# Patient Record
Sex: Female | Born: 1965
Health system: Southern US, Community
[De-identification: ages and names within clinical notes are randomized; demographics above are authoritative.]

## PROBLEM LIST (undated history)

## (undated) DIAGNOSIS — R87619 Unspecified abnormal cytological findings in specimens from cervix uteri: Secondary | ICD-10-CM

## (undated) DIAGNOSIS — G589 Mononeuropathy, unspecified: Secondary | ICD-10-CM

## (undated) DIAGNOSIS — I2699 Other pulmonary embolism without acute cor pulmonale: Secondary | ICD-10-CM

## (undated) DIAGNOSIS — D649 Anemia, unspecified: Secondary | ICD-10-CM

## (undated) DIAGNOSIS — Z9289 Personal history of other medical treatment: Secondary | ICD-10-CM

## (undated) DIAGNOSIS — M797 Fibromyalgia: Secondary | ICD-10-CM

## (undated) DIAGNOSIS — G43909 Migraine, unspecified, not intractable, without status migrainosus: Secondary | ICD-10-CM

## (undated) DIAGNOSIS — E669 Obesity, unspecified: Secondary | ICD-10-CM

## (undated) DIAGNOSIS — R58 Hemorrhage, not elsewhere classified: Secondary | ICD-10-CM

## (undated) HISTORY — DX: Unspecified abnormal cytological findings in specimens from cervix uteri: R87.619

## (undated) HISTORY — PX: DILATION AND CURETTAGE OF UTERUS: SHX78

## (undated) HISTORY — DX: Hemorrhage, not elsewhere classified: R58

## (undated) HISTORY — PX: APPENDECTOMY: SHX54

## (undated) HISTORY — PX: CARDIAC CATHETERIZATION: SHX172

## (undated) HISTORY — DX: Migraine, unspecified, not intractable, without status migrainosus: G43.909

## (undated) HISTORY — DX: Mononeuropathy, unspecified: G58.9

## (undated) HISTORY — DX: Anemia, unspecified: D64.9

## (undated) HISTORY — DX: Obesity, unspecified: E66.9

## (undated) HISTORY — DX: Personal history of other medical treatment: Z92.89

## (undated) HISTORY — DX: Other pulmonary embolism without acute cor pulmonale: I26.99

## (undated) HISTORY — DX: Fibromyalgia: M79.7

## (undated) HISTORY — PX: OTHER SURGICAL HISTORY: SHX169

## (undated) HISTORY — PX: CHOLECYSTECTOMY: SHX55

---

## 2010-04-21 ENCOUNTER — Ambulatory Visit: Payer: Self-pay | Admitting: Cardiology

## 2010-08-06 LAB — PROTIME-INR: INR: 2.5 — AB (ref 0.9–1.1)

## 2010-10-08 LAB — PROTIME-INR: INR: 1.1 (ref 0.9–1.1)

## 2010-10-09 LAB — PROTIME-INR: INR: 1.6 — AB (ref 0.9–1.1)

## 2010-11-11 LAB — PROTIME-INR: INR: 2 — AB (ref 0.9–1.1)

## 2010-12-10 LAB — PROTIME-INR

## 2013-03-13 DIAGNOSIS — Z6835 Body mass index (BMI) 35.0-35.9, adult: Secondary | ICD-10-CM | POA: Diagnosis not present

## 2013-03-13 DIAGNOSIS — IMO0002 Reserved for concepts with insufficient information to code with codable children: Secondary | ICD-10-CM | POA: Diagnosis not present

## 2013-03-13 DIAGNOSIS — IMO0001 Reserved for inherently not codable concepts without codable children: Secondary | ICD-10-CM | POA: Diagnosis not present

## 2013-03-13 DIAGNOSIS — I2699 Other pulmonary embolism without acute cor pulmonale: Secondary | ICD-10-CM | POA: Diagnosis not present

## 2013-03-13 DIAGNOSIS — N912 Amenorrhea, unspecified: Secondary | ICD-10-CM | POA: Diagnosis not present

## 2013-03-13 DIAGNOSIS — T81718A Complication of other artery following a procedure, not elsewhere classified, initial encounter: Secondary | ICD-10-CM | POA: Diagnosis not present

## 2013-11-09 DIAGNOSIS — H6690 Otitis media, unspecified, unspecified ear: Secondary | ICD-10-CM | POA: Diagnosis not present

## 2013-11-09 DIAGNOSIS — H60399 Other infective otitis externa, unspecified ear: Secondary | ICD-10-CM | POA: Diagnosis not present

## 2014-09-22 DIAGNOSIS — N939 Abnormal uterine and vaginal bleeding, unspecified: Secondary | ICD-10-CM | POA: Diagnosis not present

## 2014-09-22 DIAGNOSIS — M79606 Pain in leg, unspecified: Secondary | ICD-10-CM | POA: Diagnosis not present

## 2014-09-22 DIAGNOSIS — Z86711 Personal history of pulmonary embolism: Secondary | ICD-10-CM | POA: Diagnosis not present

## 2014-09-22 DIAGNOSIS — R0602 Shortness of breath: Secondary | ICD-10-CM | POA: Diagnosis not present

## 2014-09-22 DIAGNOSIS — Z9889 Other specified postprocedural states: Secondary | ICD-10-CM | POA: Diagnosis not present

## 2014-09-22 DIAGNOSIS — Z7982 Long term (current) use of aspirin: Secondary | ICD-10-CM | POA: Diagnosis not present

## 2014-11-14 ENCOUNTER — Encounter: Payer: Self-pay | Admitting: Family

## 2014-11-14 ENCOUNTER — Ambulatory Visit (INDEPENDENT_AMBULATORY_CARE_PROVIDER_SITE_OTHER): Payer: Medicare Other | Admitting: Family

## 2014-11-14 VITALS — BP 145/103 | HR 76 | Temp 97.0°F | Ht 62.5 in | Wt 223.0 lb

## 2014-11-14 DIAGNOSIS — G629 Polyneuropathy, unspecified: Secondary | ICD-10-CM | POA: Diagnosis not present

## 2014-11-14 DIAGNOSIS — R5383 Other fatigue: Secondary | ICD-10-CM | POA: Diagnosis not present

## 2014-11-14 DIAGNOSIS — E559 Vitamin D deficiency, unspecified: Secondary | ICD-10-CM | POA: Diagnosis not present

## 2014-11-14 DIAGNOSIS — Z13220 Encounter for screening for lipoid disorders: Secondary | ICD-10-CM

## 2014-11-14 DIAGNOSIS — I10 Essential (primary) hypertension: Secondary | ICD-10-CM | POA: Diagnosis not present

## 2014-11-14 DIAGNOSIS — D6489 Other specified anemias: Secondary | ICD-10-CM | POA: Diagnosis not present

## 2014-11-14 DIAGNOSIS — G4700 Insomnia, unspecified: Secondary | ICD-10-CM | POA: Insufficient documentation

## 2014-11-14 DIAGNOSIS — Z1321 Encounter for screening for nutritional disorder: Secondary | ICD-10-CM | POA: Diagnosis not present

## 2014-11-14 DIAGNOSIS — Z8349 Family history of other endocrine, nutritional and metabolic diseases: Secondary | ICD-10-CM

## 2014-11-14 DIAGNOSIS — Z8342 Family history of familial hypercholesterolemia: Secondary | ICD-10-CM

## 2014-11-14 DIAGNOSIS — D649 Anemia, unspecified: Secondary | ICD-10-CM | POA: Insufficient documentation

## 2014-11-14 MED ORDER — GABAPENTIN 100 MG PO CAPS: 100.0000 mg | ORAL_CAPSULE | Freq: Two times a day (BID) | ORAL | Status: DC

## 2014-11-14 MED ORDER — HYDROCHLOROTHIAZIDE 25 MG PO TABS: 25.0000 mg | ORAL_TABLET | Freq: Every day | ORAL | Status: DC

## 2014-11-14 NOTE — Patient Instructions (Addendum)
Health Maintenance Adopting a healthy lifestyle and getting preventive care can go a long way to promote health and wellness. Talk with your health care provider about what schedule of regular examinations is right for you. This is a good chance for you to check in with your provider about disease prevention and staying healthy. In between checkups, there are plenty of things you can do on your own. Experts have done a lot of research about which lifestyle changes and preventive measures are most likely to keep you healthy. Ask your health care provider for more information. WEIGHT AND DIET  Eat a healthy diet  Be sure to include plenty of vegetables, fruits, low-fat dairy products, and lean protein.  Do not eat a lot of foods high in solid fats, added sugars, or salt.  Get regular exercise. This is one of the most important things you can do for your health.  Most adults should exercise for at least 150 minutes each week. The exercise should increase your heart rate and make you sweat (moderate-intensity exercise).  Most adults should also do strengthening exercises at least twice a week. This is in addition to the moderate-intensity exercise.  Maintain a healthy weight  Body mass index (BMI) is a measurement that can be used to identify possible weight problems. It estimates body fat based on height and weight. Your health care provider can help determine your BMI and help you achieve or maintain a healthy weight.  For females 25 years of age and older:   A BMI below 18.5 is considered underweight.  A BMI of 18.5 to 24.9 is normal.  A BMI of 25 to 29.9 is considered overweight.  A BMI of 30 and above is considered obese.  Watch levels of cholesterol and blood lipids  You should start having your blood tested for lipids and cholesterol at 47 years of age, then have this test every 5 years.  You may need to have your cholesterol levels checked more often if:  Your lipid or  cholesterol levels are high.  You are older than 47 years of age.  You are at high risk for heart disease.  CANCER SCREENING   Lung Cancer  Lung cancer screening is recommended for adults 97-92 years old who are at high risk for lung cancer because of a history of smoking.  A yearly low-dose CT scan of the lungs is recommended for people who:  Currently smoke.  Have quit within the past 15 years.  Have at least a 30-pack-year history of smoking. A pack year is smoking an average of one pack of cigarettes a day for 1 year.  Yearly screening should continue until it has been 15 years since you quit.  Yearly screening should stop if you develop a health problem that would prevent you from having lung cancer treatment.  Breast Cancer  Practice breast self-awareness. This means understanding how your breasts normally appear and feel.  It also means doing regular breast self-exams. Let your health care provider know about any changes, no matter how small.  If you are in your 20s or 30s, you should have a clinical breast exam (CBE) by a health care provider every 1-3 years as part of a regular health exam.  If you are 76 or older, have a CBE every year. Also consider having a breast X-ray (mammogram) every year.  If you have a family history of breast cancer, talk to your health care provider about genetic screening.  If you are  at high risk for breast cancer, talk to your health care provider about having an MRI and a mammogram every year.  Breast cancer gene (BRCA) assessment is recommended for women who have family members with BRCA-related cancers. BRCA-related cancers include:  Breast.  Ovarian.  Tubal.  Peritoneal cancers.  Results of the assessment will determine the need for genetic counseling and BRCA1 and BRCA2 testing. Cervical Cancer Routine pelvic examinations to screen for cervical cancer are no longer recommended for nonpregnant women who are considered low  risk for cancer of the pelvic organs (ovaries, uterus, and vagina) and who do not have symptoms. A pelvic examination may be necessary if you have symptoms including those associated with pelvic infections. Ask your health care provider if a screening pelvic exam is right for you.   The Pap test is the screening test for cervical cancer for women who are considered at risk.  If you had a hysterectomy for a problem that was not cancer or a condition that could lead to cancer, then you no longer need Pap tests.  If you are older than 65 years, and you have had normal Pap tests for the past 10 years, you no longer need to have Pap tests.  If you have had past treatment for cervical cancer or a condition that could lead to cancer, you need Pap tests and screening for cancer for at least 20 years after your treatment.  If you no longer get a Pap test, assess your risk factors if they change (such as having a new sexual partner). This can affect whether you should start being screened again.  Some women have medical problems that increase their chance of getting cervical cancer. If this is the case for you, your health care provider may recommend more frequent screening and Pap tests.  The human papillomavirus (HPV) test is another test that may be used for cervical cancer screening. The HPV test looks for the virus that can cause cell changes in the cervix. The cells collected during the Pap test can be tested for HPV.  The HPV test can be used to screen women 30 years of age and older. Getting tested for HPV can extend the interval between normal Pap tests from three to five years.  An HPV test also should be used to screen women of any age who have unclear Pap test results.  After 47 years of age, women should have HPV testing as often as Pap tests.  Colorectal Cancer  This type of cancer can be detected and often prevented.  Routine colorectal cancer screening usually begins at 47 years of  age and continues through 47 years of age.  Your health care provider may recommend screening at an earlier age if you have risk factors for colon cancer.  Your health care provider may also recommend using home test kits to check for hidden blood in the stool.  A small camera at the end of a tube can be used to examine your colon directly (sigmoidoscopy or colonoscopy). This is done to check for the earliest forms of colorectal cancer.  Routine screening usually begins at age 50.  Direct examination of the colon should be repeated every 5-10 years through 47 years of age. However, you may need to be screened more often if early forms of precancerous polyps or small growths are found. Skin Cancer  Check your skin from head to toe regularly.  Tell your health care provider about any new moles or changes in   moles, especially if there is a change in a mole's shape or color.  Also tell your health care provider if you have a mole that is larger than the size of a pencil eraser.  Always use sunscreen. Apply sunscreen liberally and repeatedly throughout the day.  Protect yourself by wearing long sleeves, pants, a wide-brimmed hat, and sunglasses whenever you are outside. HEART DISEASE, DIABETES, AND HIGH BLOOD PRESSURE   Have your blood pressure checked at least every 1-2 years. High blood pressure causes heart disease and increases the risk of stroke.  If you are between 75 years and 42 years old, ask your health care provider if you should take aspirin to prevent strokes.  Have regular diabetes screenings. This involves taking a blood sample to check your fasting blood sugar level.  If you are at a normal weight and have a low risk for diabetes, have this test once every three years after 47 years of age.  If you are overweight and have a high risk for diabetes, consider being tested at a younger age or more often. PREVENTING INFECTION  Hepatitis B  If you have a higher risk for  hepatitis B, you should be screened for this virus. You are considered at high risk for hepatitis B if:  You were born in a country where hepatitis B is common. Ask your health care provider which countries are considered high risk.  Your parents were born in a high-risk country, and you have not been immunized against hepatitis B (hepatitis B vaccine).  You have HIV or AIDS.  You use needles to inject street drugs.  You live with someone who has hepatitis B.  You have had sex with someone who has hepatitis B.  You get hemodialysis treatment.  You take certain medicines for conditions, including cancer, organ transplantation, and autoimmune conditions. Hepatitis C  Blood testing is recommended for:  Everyone born from [birth year] through 1963.  Anyone with known risk factors for hepatitis C. Sexually transmitted infections (STIs)  You should be screened for sexually transmitted infections (STIs) including gonorrhea and chlamydia if:  You are sexually active and are younger than 48 years of age.  You are older than 47 years of age and your health care provider tells you that you are at risk for this type of infection.  Your sexual activity has changed since you were last screened and you are at an increased risk for chlamydia or gonorrhea. Ask your health care provider if you are at risk.  If you do not have HIV, but are at risk, it may be recommended that you take a prescription medicine daily to prevent HIV infection. This is called pre-exposure prophylaxis (PrEP). You are considered at risk if:  You are sexually active and do not regularly use condoms or know the HIV status of your partner(s).  You take drugs by injection.  You are sexually active with a partner who has HIV. Talk with your health care provider about whether you are at high risk of being infected with HIV. If you choose to begin PrEP, you should first be tested for HIV. You should then be tested every 3 months for  as long as you are taking PrEP.  PREGNANCY   If you are premenopausal and you may become pregnant, ask your health care provider about preconception counseling.  If you may become pregnant, take 400 to 800 micrograms (mcg) of folic acid every day.  If you want to prevent pregnancy, talk to your  health care provider about birth control (contraception). OSTEOPOROSIS AND MENOPAUSE   Osteoporosis is a disease in which the bones lose minerals and strength with aging. This can result in serious bone fractures. Your risk for osteoporosis can be identified using a bone density scan.  If you are 38 years of age or older, or if you are at risk for osteoporosis and fractures, ask your health care provider if you should be screened.  Ask your health care provider whether you should take a calcium or vitamin D supplement to lower your risk for osteoporosis.  Menopause may have certain physical symptoms and risks.  Hormone replacement therapy may reduce some of these symptoms and risks. Talk to your health care provider about whether hormone replacement therapy is right for you.  HOME CARE INSTRUCTIONS   Schedule regular health, dental, and eye exams.  Stay current with your immunizations.   Do not use any tobacco products including cigarettes, chewing tobacco, or electronic cigarettes.  If you are pregnant, do not drink alcohol.  If you are breastfeeding, limit how much and how often you drink alcohol.  Limit alcohol intake to no more than 1 drink per day for nonpregnant women. One drink equals 12 ounces of beer, 5 ounces of wine, or 1 ounces of hard liquor.  Do not use street drugs.  Do not share needles.  Ask your health care provider for help if you need support or information about quitting drugs.  Tell your health care provider if you often feel depressed.  Tell your health care provider if you have ever been abused or do not feel safe at home. Document Released: 03/16/2011  Document Revised: 01/15/2014 Document Reviewed: 08/02/2013 [facility] Patient Information 2015 [place], [place]. This information is not intended to replace advice given to you by your health care provider. Make sure you discuss any questions you have with your health care provider. DASH Eating Plan DASH stands for "Dietary Approaches to Stop Hypertension." The DASH eating plan is a healthy eating plan that has been shown to reduce high blood pressure (hypertension). Additional health benefits may include reducing the risk of type 2 diabetes mellitus, heart disease, and stroke. The DASH eating plan may also help with weight loss. WHAT DO I NEED TO KNOW ABOUT THE DASH EATING PLAN? For the DASH eating plan, you will follow these general guidelines:  Choose foods with a percent daily value for sodium of less than 5% (as listed on the food label).  Use salt-free seasonings or herbs instead of table salt or sea salt.  Check with your health care provider or pharmacist before using salt substitutes.  Eat lower-sodium products, often labeled as "lower sodium" or "no salt added."  Eat fresh foods.  Eat more vegetables, fruits, and low-fat dairy products.  Choose whole grains. Look for the word "whole" as the first word in the ingredient list.  Choose fish and skinless chicken or [place] more often than red meat. Limit fish, poultry, and meat to 6 oz (170 g) each day.  Limit sweets, desserts, sugars, and sugary drinks.  Choose heart-healthy fats.  Limit cheese to 1 oz (28 g) per day.  Eat more home-cooked food and less restaurant, buffet, and fast food.  Limit fried foods.  Cook foods using methods other than frying.  Limit canned vegetables. If you do use them, rinse them well to decrease the sodium.  When eating at a restaurant, ask that your food be prepared with less salt, or no salt if possible.  WHAT FOODS CAN I EAT? Seek help from a dietitian for individual calorie  needs. Grains Whole grain or whole wheat bread. Brown rice. Whole grain or whole wheat pasta. Quinoa, bulgur, and whole grain cereals. Low-sodium cereals. Corn or whole wheat flour tortillas. Whole grain cornbread. Whole grain crackers. Low-sodium crackers. Vegetables Fresh or frozen vegetables (raw, steamed, roasted, or grilled). Low-sodium or reduced-sodium tomato and vegetable juices. Low-sodium or reduced-sodium tomato sauce and paste. Low-sodium or reduced-sodium canned vegetables.  Fruits All fresh, canned (in natural juice), or frozen fruits. Meat and Other Protein Products Ground beef (85% or leaner), grass-fed beef, or beef trimmed of fat. Skinless chicken or [place]. Ground chicken or [place]. Pork trimmed of fat. All fish and seafood. Eggs. Dried beans, peas, or lentils. Unsalted nuts and seeds. Unsalted canned beans. Dairy Low-fat dairy products, such as skim or 1% milk, 2% or reduced-fat cheeses, low-fat ricotta or cottage cheese, or plain low-fat yogurt. Low-sodium or reduced-sodium cheeses. Fats and Oils Tub margarines without trans fats. Light or reduced-fat mayonnaise and salad dressings (reduced sodium). Avocado. Safflower, olive, or canola oils. Natural peanut or almond butter. Other Unsalted popcorn and pretzels. The items listed above may not be a complete list of recommended foods or beverages. Contact your dietitian for more options. WHAT FOODS ARE NOT RECOMMENDED? Grains Dokes bread. Bodenheimer pasta. Neyer rice. Refined cornbread. Bagels and croissants. Crackers that contain trans fat. Vegetables Creamed or fried vegetables. Vegetables in a cheese sauce. Regular canned vegetables. Regular canned tomato sauce and paste. Regular tomato and vegetable juices. Fruits Dried fruits. Canned fruit in light or heavy syrup. Fruit juice. Meat and Other Protein Products Fatty cuts of meat. Ribs, chicken wings, bacon, sausage, bologna, salami, chitterlings, fatback, hot dogs, bratwurst,  and packaged luncheon meats. Salted nuts and seeds. Canned beans with salt. Dairy Whole or 2% milk, cream, half-and-half, and cream cheese. Whole-fat or sweetened yogurt. Full-fat cheeses or blue cheese. Nondairy creamers and whipped toppings. Processed cheese, cheese spreads, or cheese curds. Condiments Onion and garlic salt, seasoned salt, table salt, and sea salt. Canned and packaged gravies. Worcestershire sauce. Tartar sauce. Barbecue sauce. Teriyaki sauce. Soy sauce, including reduced sodium. Steak sauce. Fish sauce. Oyster sauce. Cocktail sauce. Horseradish. Ketchup and mustard. Meat flavorings and tenderizers. Bouillon cubes. Hot sauce. Tabasco sauce. Marinades. Taco seasonings. Relishes. Fats and Oils Butter, stick margarine, lard, shortening, ghee, and bacon fat. Coconut, palm kernel, or palm oils. Regular salad dressings. Other Pickles and olives. Salted popcorn and pretzels. The items listed above may not be a complete list of foods and beverages to avoid. Contact your dietitian for more information. WHERE CAN I FIND MORE INFORMATION? National Heart, Lung, and Blood Institute: travelstabloid.com Document Released: 08/20/2011 Document Revised: 01/15/2014 Document Reviewed: 07/05/2013 [facility] Patient Information 2015 [place], [place]. This information is not intended to replace advice given to you by your health care provider. Make sure you discuss any questions you have with your health care provider. Hypertension Hypertension, commonly called high blood pressure, is when the force of blood pumping through your arteries is too strong. Your arteries are the blood vessels that carry blood from your heart throughout your body. A blood pressure reading consists of a higher number over a lower number, such as 110/72. The higher number (systolic) is the pressure inside your arteries when your heart pumps. The lower number (diastolic) is the pressure inside your  arteries when your heart relaxes. Ideally you want your blood pressure below 120/80. Hypertension forces your heart to work harder  to pump blood. Your arteries may become narrow or stiff. Having hypertension puts you at risk for heart disease, stroke, and other problems.  RISK FACTORS Some risk factors for high blood pressure are controllable. Others are not.  Risk factors you cannot control include:   Race. You may be at higher risk if you are African American.  Age. Risk increases with age.  Gender. Men are at higher risk than women before age 45 years. After age 65, women are at higher risk than men. Risk factors you can control include:  Not getting enough exercise or physical activity.  Being overweight.  Getting too much fat, sugar, calories, or salt in your diet.  Drinking too much alcohol. SIGNS AND SYMPTOMS Hypertension does not usually cause signs or symptoms. Extremely high blood pressure (hypertensive crisis) may cause headache, anxiety, shortness of breath, and nosebleed. DIAGNOSIS  To check if you have hypertension, your health care provider will measure your blood pressure while you are seated, with your arm held at the level of your heart. It should be measured at least twice using the same arm. Certain conditions can cause a difference in blood pressure between your right and left arms. A blood pressure reading that is higher than normal on one occasion does not mean that you need treatment. If one blood pressure reading is high, ask your health care provider about having it checked again. TREATMENT  Treating high blood pressure includes making lifestyle changes and possibly taking medicine. Living a healthy lifestyle can help lower high blood pressure. You may need to change some of your habits. Lifestyle changes may include:  Following the DASH diet. This diet is high in fruits, vegetables, and whole grains. It is low in salt, red meat, and added sugars.  Getting at  least 2 hours of brisk physical activity every week.  Losing weight if necessary.  Not smoking.  Limiting alcoholic beverages.  Learning ways to reduce stress. If lifestyle changes are not enough to get your blood pressure under control, your health care provider may prescribe medicine. You may need to take more than one. Work closely with your health care provider to understand the risks and benefits. HOME CARE INSTRUCTIONS  Have your blood pressure rechecked as directed by your health care provider.   Take medicines only as directed by your health care provider. Follow the directions carefully. Blood pressure medicines must be taken as prescribed. The medicine does not work as well when you skip doses. Skipping doses also puts you at risk for problems.   Do not smoke.   Monitor your blood pressure at home as directed by your health care provider. SEEK MEDICAL CARE IF:   You think you are having a reaction to medicines taken.  You have recurrent headaches or feel dizzy.  You have swelling in your ankles.  You have trouble with your vision. SEEK IMMEDIATE MEDICAL CARE IF:  You develop a severe headache or confusion.  You have unusual weakness, numbness, or feel faint.  You have severe chest or abdominal pain.  You vomit repeatedly.  You have trouble breathing. MAKE SURE YOU:   Understand these instructions.  Will watch your condition.  Will get help right away if you are not doing well or get worse. Document Released: 08/31/2005 Document Revised: 01/15/2014 Document Reviewed: 06/23/2013 [employer] Patient Information 2015 [employer]. This information is not intended to replace advice given to you by your health care provider. Make sure you discuss any questions you have with   your health care provider.

## 2014-11-14 NOTE — Progress Notes (Signed)
   Subjective:    Patient ID: Nancy Watts, female    DOB: 1965/12/01, 49 y.o.   MRN: [id number]  HPI Pt presents to the office to establish care. Pt currently taking gabapentin $RemoveBeforeDEI'100mg'[id number]$  BID for peripheral neuropathy pain. Pt also takes melatonin for sleep every night. 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 Pt is currently taking aspirin daily instead. Pt states she has a history of a "bleeding disorder" but was never diagnosed with a name. Pt states her Hgb dropped to 4 in the past. Pt states her bleeding is stable at this point.     Review of Systems  Constitutional: Negative.   HENT: Negative.   Eyes: Negative.   Respiratory: Negative.  Negative for shortness of breath.   Cardiovascular: Negative.  Negative for palpitations.  Gastrointestinal: Negative.   Endocrine: Negative.   Genitourinary: Negative.   Musculoskeletal: Negative.   Neurological: Negative.  Negative for headaches.  Hematological: Negative.   Psychiatric/Behavioral: Negative.   All other systems reviewed and are negative.      Objective:   Physical Exam  Constitutional: She is oriented to person, place, and time. She appears well-developed and well-nourished. No distress.  HENT:  Head: Normocephalic and atraumatic.  Right Ear: External ear normal.  Left Ear: External ear normal.  Nose: Nose normal.  Mouth/Throat: Oropharynx is clear and moist.  Eyes: Pupils are equal, round, and reactive to light.  Neck: Normal range of motion. Neck supple. No thyromegaly present.  Cardiovascular: Normal rate, regular rhythm, normal heart sounds and intact distal pulses.   No murmur heard. Pulmonary/Chest: Effort normal and breath sounds normal. No respiratory distress. She has no wheezes.  Abdominal: Soft. Bowel sounds are normal. She exhibits no distension. There is no tenderness.  Musculoskeletal: Normal range of motion. She exhibits no edema or tenderness.  Neurological: She is alert  and oriented to person, place, and time. She has normal reflexes. No cranial nerve deficit.  Skin: Skin is warm and dry.  Psychiatric: She has a normal mood and affect. Her behavior is normal. Judgment and thought content normal.  Vitals reviewed.   BP 154/120 mmHg  Pulse 70  Temp(Src) 97 F (36.1 C) (Oral)  Ht 5' 2.5" (1.588 m)  Wt 223 lb (101.152 kg)  BMI 40.11 kg/m2  LMP 10/22/2014       Assessment & Plan:  1. Insomnia - CMP14+EGFR  2. Peripheral neuropathy - CMP14+EGFR - gabapentin (NEURONTIN) 100 MG capsule; Take 1 capsule (100 mg total) by mouth 2 (two) times daily.  Dispense: 180 capsule; Refill: 4  3. Anemia due to other cause - Anemia Profile B  4. Screening cholesterol level - Lipid panel  5. Encounter for vitamin deficiency screening - Vit D  25 hydroxy (rtn osteoporosis monitoring)  6. Other fatigue - Vit D  25 hydroxy (rtn osteoporosis monitoring) - Anemia Profile B - Thyroid Panel With TSH  7. Family history of high cholesterol - Lipid panel  8. Essential hypertension Pt started on HCTZ today -Dash diet information given -Exercise encouraged - Stress Management  -Continue current meds -RTO in 2 weeks - hydrochlorothiazide (HYDRODIURIL) 25 MG tablet; Take 1 tablet (25 mg total) by mouth daily.  Dispense: 90 tablet; Refill: 3   Continue all meds Labs pending Health Maintenance reviewed Diet and exercise encouraged RTO 2 weeks  Evelina Dun, FNP

## 2014-11-15 LAB — ANEMIA PROFILE B
Basophils Absolute: 0 10*3/uL (ref 0.0–0.2)
Basos: 1 %
Eos: 5 %
Eosinophils Absolute: 0.4 10*3/uL (ref 0.0–0.4)
Ferritin: 27 ng/mL (ref 15–150)
Folate: 17.4 ng/mL (ref >3.0)
HCT: 41.7 % (ref 34.0–46.6)
Hemoglobin: 13.9 g/dL (ref 11.1–15.9)
Immature Grans (Abs): 0 10*3/uL (ref 0.0–0.1)
Immature Granulocytes: 0 %
Iron Saturation: 41 % (ref 15–55)
Iron: 154 ug/dL (ref 27–159)
Lymphocytes Absolute: 1.9 10*3/uL (ref 0.7–3.1)
Lymphs: 25 %
MCH: 30.9 pg (ref 26.6–33.0)
MCHC: 33.3 g/dL (ref 31.5–35.7)
MCV: 93 fL (ref 79–97)
Monocytes Absolute: 0.5 10*3/uL (ref 0.1–0.9)
Monocytes: 7 %
Neutrophils Absolute: 4.9 10*3/uL (ref 1.4–7.0)
Neutrophils Relative %: 62 %
Platelets: 275 10*3/uL (ref 150–379)
RBC: 4.5 x10E6/uL (ref 3.77–5.28)
RDW: 13.3 % (ref 12.3–15.4)
Retic Ct Pct: 1.2 % (ref 0.6–2.6)
Total Iron Binding Capacity: 376 ug/dL (ref 250–450)
UIBC: 222 ug/dL (ref 131–425)
Vitamin B-12: 276 pg/mL (ref 211–946)
WBC: 7.7 10*3/uL (ref 3.4–10.8)

## 2014-11-15 LAB — CMP14+EGFR
ALT: 17 IU/L (ref 0–32)
AST: 17 IU/L (ref 0–40)
Albumin/Globulin Ratio: 2 (ref 1.1–2.5)
Albumin: 4.6 g/dL (ref 3.5–5.5)
Alkaline Phosphatase: 59 IU/L (ref 39–117)
BUN/Creatinine Ratio: 16 (ref 9–23)
BUN: 12 mg/dL (ref 6–24)
Bilirubin Total: 0.6 mg/dL (ref 0.0–1.2)
CO2: 23 mmol/L (ref 18–29)
Calcium: 9.6 mg/dL (ref 8.7–10.2)
Chloride: 99 mmol/L (ref 97–108)
Creatinine, Ser: 0.73 mg/dL (ref 0.57–1.00)
GFR calc Af Amer: 114 mL/min/{1.73_m2} (ref >59)
GFR calc non Af Amer: 99 mL/min/{1.73_m2} (ref >59)
Globulin, Total: 2.3 g/dL (ref 1.5–4.5)
Glucose: 91 mg/dL (ref 65–99)
Potassium: 4.2 mmol/L (ref 3.5–5.2)
Sodium: 138 mmol/L (ref 134–144)
Total Protein: 6.9 g/dL (ref 6.0–8.5)

## 2014-11-15 LAB — LIPID PANEL
Chol/HDL Ratio: 6.5 ratio units — ABNORMAL HIGH (ref 0.0–4.4)
Cholesterol, Total: 247 mg/dL — ABNORMAL HIGH (ref 100–199)
HDL: 38 mg/dL — ABNORMAL LOW (ref >39)
LDL Calculated: 149 mg/dL — ABNORMAL HIGH (ref 0–99)
Triglycerides: 299 mg/dL — ABNORMAL HIGH (ref 0–149)
VLDL Cholesterol Cal: 60 mg/dL — ABNORMAL HIGH (ref 5–40)

## 2014-11-15 LAB — THYROID PANEL WITH TSH
Free Thyroxine Index: 1.7 (ref 1.2–4.9)
T3 Uptake Ratio: 24 % (ref 24–39)
T4, Total: 7.2 ug/dL (ref 4.5–12.0)
TSH: 1.8 u[IU]/mL (ref 0.450–4.500)

## 2014-11-15 LAB — VITAMIN D 25 HYDROXY (VIT D DEFICIENCY, FRACTURES): Vit D, 25-Hydroxy: 18.7 ng/mL — ABNORMAL LOW (ref 30.0–100.0)

## 2014-11-16 ENCOUNTER — Other Ambulatory Visit: Payer: Self-pay | Admitting: Family

## 2014-11-16 ENCOUNTER — Telehealth: Payer: Self-pay | Admitting: Family

## 2014-11-16 DIAGNOSIS — E559 Vitamin D deficiency, unspecified: Secondary | ICD-10-CM

## 2014-11-16 DIAGNOSIS — E785 Hyperlipidemia, unspecified: Secondary | ICD-10-CM

## 2014-11-16 MED ORDER — ATORVASTATIN CALCIUM 40 MG PO TABS: 40.0000 mg | ORAL_TABLET | Freq: Every day | ORAL | Status: DC

## 2014-11-16 MED ORDER — VITAMIN D (ERGOCALCIFEROL) 1.25 MG (50000 UNIT) PO CAPS: 50000.0000 [IU] | ORAL_CAPSULE | ORAL | Status: DC

## 2014-11-16 NOTE — Telephone Encounter (Signed)
-----   Message from Junie Spencerhristy A Hawks, FNP sent at 11/16/2014 10:59 AM EST ----- Kidney and liver function stable Anemia Profile (Iron levels, Vitamin B12, Folate, WBC, Hgb, & Plts)- WNL Cholesterol and Triglycerides elevated- Lipitor rx sent to pharmacy Thyroid levels WNL Vit D levels low-Prescription sent to pharmacy

## 2014-11-21 NOTE — Telephone Encounter (Signed)
-----   Message from Junie Spencerhristy A Hawks, FNP sent at 11/16/2014 10:59 AM EST ----- Kidney and liver function stable Anemia Profile (Iron levels, Vitamin B12, Folate, WBC, Hgb, & Plts)- WNL Cholesterol and Triglycerides elevated- Lipitor rx sent to pharmacy Thyroid levels WNL Vit D levels low-Prescription sent to pharmacy

## 2014-11-23 NOTE — Telephone Encounter (Signed)
Patient aware.

## 2014-11-28 ENCOUNTER — Ambulatory Visit: Payer: Medicare Other | Admitting: Family

## 2014-12-21 ENCOUNTER — Ambulatory Visit (INDEPENDENT_AMBULATORY_CARE_PROVIDER_SITE_OTHER): Payer: Medicare Other | Admitting: Family

## 2014-12-21 ENCOUNTER — Encounter: Payer: Self-pay | Admitting: Family

## 2014-12-21 VITALS — BP 130/96 | HR 78 | Temp 97.2°F | Ht 62.5 in | Wt 223.2 lb

## 2014-12-21 DIAGNOSIS — Z86711 Personal history of pulmonary embolism: Secondary | ICD-10-CM | POA: Diagnosis not present

## 2014-12-21 DIAGNOSIS — N939 Abnormal uterine and vaginal bleeding, unspecified: Secondary | ICD-10-CM

## 2014-12-21 DIAGNOSIS — G609 Hereditary and idiopathic neuropathy, unspecified: Secondary | ICD-10-CM | POA: Diagnosis not present

## 2014-12-21 DIAGNOSIS — G629 Polyneuropathy, unspecified: Secondary | ICD-10-CM | POA: Diagnosis not present

## 2014-12-21 DIAGNOSIS — I10 Essential (primary) hypertension: Secondary | ICD-10-CM | POA: Diagnosis not present

## 2014-12-21 DIAGNOSIS — N898 Other specified noninflammatory disorders of vagina: Secondary | ICD-10-CM | POA: Diagnosis not present

## 2014-12-21 MED ORDER — LISINOPRIL 20 MG PO TABS: 20.0000 mg | ORAL_TABLET | Freq: Every day | ORAL | Status: DC

## 2014-12-21 MED ORDER — GABAPENTIN 100 MG PO CAPS: 100.0000 mg | ORAL_CAPSULE | Freq: Two times a day (BID) | ORAL | Status: DC

## 2014-12-21 NOTE — Progress Notes (Signed)
 Subjective:    Patient ID: Nancy Watts, female    DOB: 09/24/1965, 49 y.o.   MRN: [id number]  Pt presents to the office today to recheck BP. Pt's BP is not at goal today. Pt is also reporting abnormal bleeding when she exercises or lifts heavy objects. Pt states she has been told in the past that she needs a hysterectomy. Pt had a D/C and ablation in 2011 and states she has problems ever since.   Pt states she had a doctor, but has moved to take of her parents.   Hypertension This is a chronic problem. The current episode started more than 1 year ago. The problem has been waxing and waning since onset. Associated symptoms include peripheral edema. Pertinent negatives include no anxiety, headaches, palpitations or shortness of breath. Risk factors for coronary artery disease include dyslipidemia, obesity and family history. Past treatments include diuretics. The current treatment provides moderate improvement. There is no history of kidney disease, CAD/MI, CVA, heart failure, PVD or a thyroid problem. There is no history of sleep apnea.  Peripheral Neuropathy Pt states she is having bilateral leg pains and back pain that wakes her up from sleep. Pt states she has not taken gabapentin in several weeks, because she has ran out of medication.     Review of Systems  Constitutional: Negative.   HENT: Negative.   Eyes: Negative.   Respiratory: Negative.  Negative for shortness of breath.   Cardiovascular: Negative.  Negative for palpitations.  Gastrointestinal: Negative.   Endocrine: Negative.   Genitourinary: Negative.   Musculoskeletal: Negative.   Neurological: Negative.  Negative for headaches.  Hematological: Negative.   Psychiatric/Behavioral: Negative.   All other systems reviewed and are negative.      Objective:   Physical Exam  Constitutional: She is oriented to person, place, and time. She appears well-developed and well-nourished. No distress.  HENT:  Head:  Normocephalic and atraumatic.  Eyes: Pupils are equal, round, and reactive to light.  Neck: Normal range of motion. Neck supple. No thyromegaly present.  Cardiovascular: Normal rate, regular rhythm, normal heart sounds and intact distal pulses.   No murmur heard. Pulmonary/Chest: Effort normal and breath sounds normal. No respiratory distress. She has no wheezes.  Abdominal: Soft. Bowel sounds are normal. She exhibits no distension. There is no tenderness.  Musculoskeletal: Normal range of motion. She exhibits no edema or tenderness.  Neurological: She is alert and oriented to person, place, and time. She has normal reflexes. No cranial nerve deficit.  Skin: Skin is warm and dry.  Psychiatric: She has a normal mood and affect. Her behavior is normal. Judgment and thought content normal.  Vitals reviewed.     BP 130/96 mmHg  Pulse 78  Temp(Src) 97.2 F (36.2 C) (Oral)  Ht 5' 2.5" (1.588 m)  Wt 223 lb 3.2 oz (101.243 kg)  BMI 40.15 kg/m2  LMP  (Within Months)     Assessment & Plan:  1. Essential hypertension -Pt started on Lisinopril 20 mg today -Dash diet information given -Exercise encouraged - Stress Management  -Continue current meds -RTO in 2 weeks - BMP8+EGFR - lisinopril (PRINIVIL,ZESTRIL) 20 MG tablet; Take 1 tablet (20 mg total) by mouth daily.  Dispense: 90 tablet; Refill: 3  2. Peripheral neuropathy - BMP8+EGFR - gabapentin (NEURONTIN) 100 MG capsule; Take 1 capsule (100 mg total) by mouth 2 (two) times daily.  Dispense: 180 capsule; Refill: 4  3. Vaginal bleeding, abnormal - BMP8+EGFR - Ambulatory referral to Gynecology    Evelina Dun, FNP

## 2014-12-21 NOTE — Patient Instructions (Signed)
DASH Eating Plan DASH stands for "Dietary Approaches to Stop Hypertension." The DASH eating plan is a healthy eating plan that has been shown to reduce high blood pressure (hypertension). Additional health benefits may include reducing the risk of type 2 diabetes mellitus, heart disease, and stroke. The DASH eating plan may also help with weight loss. WHAT DO I NEED TO KNOW ABOUT THE DASH EATING PLAN? For the DASH eating plan, you will follow these general guidelines:  Choose foods with a percent daily value for sodium of less than 5% (as listed on the food label).  Use salt-free seasonings or herbs instead of table salt or sea salt.  Check with your health care provider or pharmacist before using salt substitutes.  Eat lower-sodium products, often labeled as "lower sodium" or "no salt added."  Eat fresh foods.  Eat more vegetables, fruits, and low-fat dairy products.  Choose whole grains. Look for the word "whole" as the first word in the ingredient list.  Choose fish and skinless chicken or turkey more often than red meat. Limit fish, poultry, and meat to 6 oz (170 g) each day.  Limit sweets, desserts, sugars, and sugary drinks.  Choose heart-healthy fats.  Limit cheese to 1 oz (28 g) per day.  Eat more home-cooked food and less restaurant, buffet, and fast food.  Limit fried foods.  Cook foods using methods other than frying.  Limit canned vegetables. If you do use them, rinse them well to decrease the sodium.  When eating at a restaurant, ask that your food be prepared with less salt, or no salt if possible. WHAT FOODS CAN I EAT? Seek help from a dietitian for individual calorie needs. Grains Whole grain or whole wheat bread. Brown rice. Whole grain or whole wheat pasta. Quinoa, bulgur, and whole grain cereals. Low-sodium cereals. Corn or whole wheat flour tortillas. Whole grain cornbread. Whole grain crackers. Low-sodium crackers. Vegetables Fresh or frozen vegetables  (raw, steamed, roasted, or grilled). Low-sodium or reduced-sodium tomato and vegetable juices. Low-sodium or reduced-sodium tomato sauce and paste. Low-sodium or reduced-sodium canned vegetables.  Fruits All fresh, canned (in natural juice), or frozen fruits. Meat and Other Protein Products Ground beef (85% or leaner), grass-fed beef, or beef trimmed of fat. Skinless chicken or turkey. Ground chicken or turkey. Pork trimmed of fat. All fish and seafood. Eggs. Dried beans, peas, or lentils. Unsalted nuts and seeds. Unsalted canned beans. Dairy Low-fat dairy products, such as skim or 1% milk, 2% or reduced-fat cheeses, low-fat ricotta or cottage cheese, or plain low-fat yogurt. Low-sodium or reduced-sodium cheeses. Fats and Oils Tub margarines without trans fats. Light or reduced-fat mayonnaise and salad dressings (reduced sodium). Avocado. Safflower, olive, or canola oils. Natural peanut or almond butter. Other Unsalted popcorn and pretzels. The items listed above may not be a complete list of recommended foods or beverages. Contact your dietitian for more options. WHAT FOODS ARE NOT RECOMMENDED? Grains White bread. White pasta. White rice. Refined cornbread. Bagels and croissants. Crackers that contain trans fat. Vegetables Creamed or fried vegetables. Vegetables in a cheese sauce. Regular canned vegetables. Regular canned tomato sauce and paste. Regular tomato and vegetable juices. Fruits Dried fruits. Canned fruit in light or heavy syrup. Fruit juice. Meat and Other Protein Products Fatty cuts of meat. Ribs, chicken wings, bacon, sausage, bologna, salami, chitterlings, fatback, hot dogs, bratwurst, and packaged luncheon meats. Salted nuts and seeds. Canned beans with salt. Dairy Whole or 2% milk, cream, half-and-half, and cream cheese. Whole-fat or sweetened yogurt. Full-fat   cheeses or blue cheese. Nondairy creamers and whipped toppings. Processed cheese, cheese spreads, or cheese  curds. Condiments Onion and garlic salt, seasoned salt, table salt, and sea salt. Canned and packaged gravies. Worcestershire sauce. Tartar sauce. Barbecue sauce. Teriyaki sauce. Soy sauce, including reduced sodium. Steak sauce. Fish sauce. Oyster sauce. Cocktail sauce. Horseradish. Ketchup and mustard. Meat flavorings and tenderizers. Bouillon cubes. Hot sauce. Tabasco sauce. Marinades. Taco seasonings. Relishes. Fats and Oils Butter, stick margarine, lard, shortening, ghee, and bacon fat. Coconut, palm kernel, or palm oils. Regular salad dressings. Other Pickles and olives. Salted popcorn and pretzels. The items listed above may not be a complete list of foods and beverages to avoid. Contact your dietitian for more information. WHERE CAN I FIND MORE INFORMATION? National [employer], [employer], [employer]: www.nhlbi.nih.gov/health/health-topics/topics/dash/ Document Released: 08/20/2011 Document Revised: 01/15/2014 Document Reviewed: 07/05/2013 [employer] Patient Information 2015 [employer]. This information is not intended to replace advice given to you by your health care provider. Make sure you discuss any questions you have with your health care provider. Hypertension Hypertension, commonly called high blood pressure, is when the force of blood pumping through your arteries is too strong. Your arteries are the blood vessels that carry blood from your heart throughout your body. A blood pressure reading consists of a higher number over a lower number, such as 110/72. The higher number (systolic) is the pressure inside your arteries when your heart pumps. The lower number (diastolic) is the pressure inside your arteries when your heart relaxes. Ideally you want your blood pressure below 120/80. Hypertension forces your heart to work harder to pump blood. Your arteries may become narrow or stiff. Having hypertension puts you at risk for heart disease, stroke, and other problems.  RISK  FACTORS Some risk factors for high blood pressure are controllable. Others are not.  Risk factors you cannot control include:   Race. You may be at higher risk if you are African American.  Age. Risk increases with age.  Gender. Men are at higher risk than women before age 45 years. After age 65, women are at higher risk than men. Risk factors you can control include:  Not getting enough exercise or physical activity.  Being overweight.  Getting too much fat, sugar, calories, or salt in your diet.  Drinking too much alcohol. SIGNS AND SYMPTOMS Hypertension does not usually cause signs or symptoms. Extremely high blood pressure (hypertensive crisis) may cause headache, anxiety, shortness of breath, and nosebleed. DIAGNOSIS  To check if you have hypertension, your health care provider will measure your blood pressure while you are seated, with your arm held at the level of your heart. It should be measured at least twice using the same arm. Certain conditions can cause a difference in blood pressure between your right and left arms. A blood pressure reading that is higher than normal on one occasion does not mean that you need treatment. If one blood pressure reading is high, ask your health care provider about having it checked again. TREATMENT  Treating high blood pressure includes making lifestyle changes and possibly taking medicine. Living a healthy lifestyle can help lower high blood pressure. You may need to change some of your habits. Lifestyle changes may include:  Following the DASH diet. This diet is high in fruits, vegetables, and whole grains. It is low in salt, red meat, and added sugars.  Getting at least 2 hours of brisk physical activity every week.  Losing weight if necessary.  Not smoking.  Limiting   alcoholic beverages.  Learning ways to reduce stress. If lifestyle changes are not enough to get your blood pressure under control, your health care provider may  prescribe medicine. You may need to take more than one. Work closely with your health care provider to understand the risks and benefits. HOME CARE INSTRUCTIONS  Have your blood pressure rechecked as directed by your health care provider.   Take medicines only as directed by your health care provider. Follow the directions carefully. Blood pressure medicines must be taken as prescribed. The medicine does not work as well when you skip doses. Skipping doses also puts you at risk for problems.   Do not smoke.   Monitor your blood pressure at home as directed by your health care provider. SEEK MEDICAL CARE IF:   You think you are having a reaction to medicines taken.  You have recurrent headaches or feel dizzy.  You have swelling in your ankles.  You have trouble with your vision. SEEK IMMEDIATE MEDICAL CARE IF:  You develop a severe headache or confusion.  You have unusual weakness, numbness, or feel faint.  You have severe chest or abdominal pain.  You vomit repeatedly.  You have trouble breathing. MAKE SURE YOU:   Understand these instructions.  Will watch your condition.  Will get help right away if you are not doing well or get worse. Document Released: 08/31/2005 Document Revised: 01/15/2014 Document Reviewed: 06/23/2013 [employer] Patient Information 2015 [employer]. This information is not intended to replace advice given to you by your health care provider. Make sure you discuss any questions you have with your health care provider.  

## 2014-12-22 LAB — BMP8+EGFR
BUN/Creatinine Ratio: 14 (ref 9–23)
BUN: 10 mg/dL (ref 6–24)
CO2: 24 mmol/L (ref 18–29)
Calcium: 9.5 mg/dL (ref 8.7–10.2)
Chloride: 100 mmol/L (ref 97–108)
Creatinine, Ser: 0.71 mg/dL (ref 0.57–1.00)
GFR calc Af Amer: 118 mL/min/{1.73_m2} (ref >59)
GFR calc non Af Amer: 102 mL/min/{1.73_m2} (ref >59)
Glucose: 87 mg/dL (ref 65–99)
Potassium: 4.5 mmol/L (ref 3.5–5.2)
Sodium: 137 mmol/L (ref 134–144)

## 2015-01-08 ENCOUNTER — Encounter: Payer: Self-pay | Admitting: Family

## 2015-01-08 ENCOUNTER — Ambulatory Visit (INDEPENDENT_AMBULATORY_CARE_PROVIDER_SITE_OTHER): Payer: Medicare Other | Admitting: Family

## 2015-01-08 VITALS — BP 117/86 | HR 75 | Temp 97.5°F | Ht 62.0 in | Wt 220.0 lb

## 2015-01-08 DIAGNOSIS — I10 Essential (primary) hypertension: Secondary | ICD-10-CM | POA: Diagnosis not present

## 2015-01-08 MED ORDER — ORLISTAT 120 MG PO CAPS: 120.0000 mg | ORAL_CAPSULE | Freq: Three times a day (TID) | ORAL | Status: DC

## 2015-01-08 NOTE — Patient Instructions (Addendum)
DASH Eating Plan DASH stands for "Dietary Approaches to Stop Hypertension." The DASH eating plan is a healthy eating plan that has been shown to reduce high blood pressure (hypertension). Additional health benefits may include reducing the risk of type 2 diabetes mellitus, heart disease, and stroke. The DASH eating plan may also help with weight loss. WHAT DO I NEED TO KNOW ABOUT THE DASH EATING PLAN? For the DASH eating plan, you will follow these general guidelines:  Choose foods with a percent daily value for sodium of less than 5% (as listed on the food label).  Use salt-free seasonings or herbs instead of table salt or sea salt.  Check with your health care provider or pharmacist before using salt substitutes.  Eat lower-sodium products, often labeled as "lower sodium" or "no salt added."  Eat fresh foods.  Eat more vegetables, fruits, and low-fat dairy products.  Choose whole grains. Look for the word "whole" as the first word in the ingredient list.  Choose fish and skinless chicken or turkey more often than red meat. Limit fish, poultry, and meat to 6 oz (170 g) each day.  Limit sweets, desserts, sugars, and sugary drinks.  Choose heart-healthy fats.  Limit cheese to 1 oz (28 g) per day.  Eat more home-cooked food and less restaurant, buffet, and fast food.  Limit fried foods.  Cook foods using methods other than frying.  Limit canned vegetables. If you do use them, rinse them well to decrease the sodium.  When eating at a restaurant, ask that your food be prepared with less salt, or no salt if possible. WHAT FOODS CAN I EAT? Seek help from a dietitian for individual calorie needs. Grains Whole grain or whole wheat bread. Brown rice. Whole grain or whole wheat pasta. Quinoa, bulgur, and whole grain cereals. Low-sodium cereals. Corn or whole wheat flour tortillas. Whole grain cornbread. Whole grain crackers. Low-sodium crackers. Vegetables Fresh or frozen vegetables  (raw, steamed, roasted, or grilled). Low-sodium or reduced-sodium tomato and vegetable juices. Low-sodium or reduced-sodium tomato sauce and paste. Low-sodium or reduced-sodium canned vegetables.  Fruits All fresh, canned (in natural juice), or frozen fruits. Meat and Other Protein Products Ground beef (85% or leaner), grass-fed beef, or beef trimmed of fat. Skinless chicken or turkey. Ground chicken or turkey. Pork trimmed of fat. All fish and seafood. Eggs. Dried beans, peas, or lentils. Unsalted nuts and seeds. Unsalted canned beans. Dairy Low-fat dairy products, such as skim or 1% milk, 2% or reduced-fat cheeses, low-fat ricotta or cottage cheese, or plain low-fat yogurt. Low-sodium or reduced-sodium cheeses. Fats and Oils Tub margarines without trans fats. Light or reduced-fat mayonnaise and salad dressings (reduced sodium). Avocado. Safflower, olive, or canola oils. Natural peanut or almond butter. Other Unsalted popcorn and pretzels. The items listed above may not be a complete list of recommended foods or beverages. Contact your dietitian for more options. WHAT FOODS ARE NOT RECOMMENDED? Grains White bread. White pasta. White rice. Refined cornbread. Bagels and croissants. Crackers that contain trans fat. Vegetables Creamed or fried vegetables. Vegetables in a cheese sauce. Regular canned vegetables. Regular canned tomato sauce and paste. Regular tomato and vegetable juices. Fruits Dried fruits. Canned fruit in light or heavy syrup. Fruit juice. Meat and Other Protein Products Fatty cuts of meat. Ribs, chicken wings, bacon, sausage, bologna, salami, chitterlings, fatback, hot dogs, bratwurst, and packaged luncheon meats. Salted nuts and seeds. Canned beans with salt. Dairy Whole or 2% milk, cream, half-and-half, and cream cheese. Whole-fat or sweetened yogurt. Full-fat   cheeses or blue cheese. Nondairy creamers and whipped toppings. Processed cheese, cheese spreads, or cheese  curds. Condiments Onion and garlic salt, seasoned salt, table salt, and sea salt. Canned and packaged gravies. Worcestershire sauce. Tartar sauce. Barbecue sauce. Teriyaki sauce. Soy sauce, including reduced sodium. Steak sauce. Fish sauce. Oyster sauce. Cocktail sauce. Horseradish. Ketchup and mustard. Meat flavorings and tenderizers. Bouillon cubes. Hot sauce. Tabasco sauce. Marinades. Taco seasonings. Relishes. Fats and Oils Butter, stick margarine, lard, shortening, ghee, and bacon fat. Coconut, palm kernel, or palm oils. Regular salad dressings. Other Pickles and olives. Salted popcorn and pretzels. The items listed above may not be a complete list of foods and beverages to avoid. Contact your dietitian for more information. WHERE CAN I FIND MORE INFORMATION? National Heart, Lung, and Blood Institute: travelstabloid.com Document Released: 08/20/2011 Document Revised: 01/15/2014 Document Reviewed: 07/05/2013 [facility] Patient Information 2015 [place], [place]. This information is not intended to replace advice given to you by your health care provider. Make sure you discuss any questions you have with your health care provider. Hypertension Hypertension, commonly called high blood pressure, is when the force of blood pumping through your arteries is too strong. Your arteries are the blood vessels that carry blood from your heart throughout your body. A blood pressure reading consists of a higher number over a lower number, such as 110/72. The higher number (systolic) is the pressure inside your arteries when your heart pumps. The lower number (diastolic) is the pressure inside your arteries when your heart relaxes. Ideally you want your blood pressure below 120/80. Hypertension forces your heart to work harder to pump blood. Your arteries may become narrow or stiff. Having hypertension puts you at risk for heart disease, stroke, and other problems.  RISK  FACTORS Some risk factors for high blood pressure are controllable. Others are not.  Risk factors you cannot control include:   Race. You may be at higher risk if you are African American.  Age. Risk increases with age.  Gender. Men are at higher risk than women before age 37 years. After age 55, women are at higher risk than men. Risk factors you can control include:  Not getting enough exercise or physical activity.  Being overweight.  Getting too much fat, sugar, calories, or salt in your diet.  Drinking too much alcohol. SIGNS AND SYMPTOMS Hypertension does not usually cause signs or symptoms. Extremely high blood pressure (hypertensive crisis) may cause headache, anxiety, shortness of breath, and nosebleed. DIAGNOSIS  To check if you have hypertension, your health care provider will measure your blood pressure while you are seated, with your arm held at the level of your heart. It should be measured at least twice using the same arm. Certain conditions can cause a difference in blood pressure between your right and left arms. A blood pressure reading that is higher than normal on one occasion does not mean that you need treatment. If one blood pressure reading is high, ask your health care provider about having it checked again. TREATMENT  Treating high blood pressure includes making lifestyle changes and possibly taking medicine. Living a healthy lifestyle can help lower high blood pressure. You may need to change some of your habits. Lifestyle changes may include:  Following the DASH diet. This diet is high in fruits, vegetables, and whole grains. It is low in salt, red meat, and added sugars.  Getting at least 2 hours of brisk physical activity every week.  Losing weight if necessary.  Not smoking.  Limiting  alcoholic beverages.  Learning ways to reduce stress. If lifestyle changes are not enough to get your blood pressure under control, your health care provider may  prescribe medicine. You may need to take more than one. Work closely with your health care provider to understand the risks and benefits. HOME CARE INSTRUCTIONS  Have your blood pressure rechecked as directed by your health care provider.   Take medicines only as directed by your health care provider. Follow the directions carefully. Blood pressure medicines must be taken as prescribed. The medicine does not work as well when you skip doses. Skipping doses also puts you at risk for problems.   Do not smoke.   Monitor your blood pressure at home as directed by your health care provider. SEEK MEDICAL CARE IF:   You think you are having a reaction to medicines taken.  You have recurrent headaches or feel dizzy.  You have swelling in your ankles.  You have trouble with your vision. SEEK IMMEDIATE MEDICAL CARE IF:  You develop a severe headache or confusion.  You have unusual weakness, numbness, or feel faint.  You have severe chest or abdominal pain.  You vomit repeatedly.  You have trouble breathing. MAKE SURE YOU:   Understand these instructions.  Will watch your condition.  Will get help right away if you are not doing well or get worse. Document Released: 08/31/2005 Document Revised: 01/15/2014 Document Reviewed: 06/23/2013 [facility] Patient Information 2015 [place]. This information is not intended to replace advice given to you by your health care provider. Make sure you discuss any questions you have with your health care provider. Exercise to Lose Weight Exercise and a healthy diet may help you lose weight. Your doctor may suggest specific exercises. EXERCISE IDEAS AND TIPS  Choose low-cost things you enjoy doing, such as walking, bicycling, or exercising to workout videos.  Take stairs instead of the elevator.  Walk during your lunch break.  Park your car further away from work or school.  Go to a gym or an exercise class.  Start with 5 to 10  minutes of exercise each day. Build up to 30 minutes of exercise 4 to 6 days a week.  Wear shoes with good support and comfortable clothes.  Stretch before and after working out.  Work out until you breathe harder and your heart beats faster.  Drink extra water when you exercise.  Do not do so much that you hurt yourself, feel dizzy, or get very short of breath. Exercises that burn about 150 calories:  Running 1  miles in 15 minutes.  Playing volleyball for 45 to 60 minutes.  Washing and waxing a car for 45 to 60 minutes.  Playing touch football for 45 minutes.  Walking 1  miles in 35 minutes.  Pushing a stroller 1  miles in 30 minutes.  Playing basketball for 30 minutes.  Raking leaves for 30 minutes.  Bicycling 5 miles in 30 minutes.  Walking 2 miles in 30 minutes.  Dancing for 30 minutes.  Shoveling snow for 15 minutes.  Swimming laps for 20 minutes.  Walking up stairs for 15 minutes.  Bicycling 4 miles in 15 minutes.  Gardening for 30 to 45 minutes.  Jumping rope for 15 minutes.  Washing windows or floors for 45 to 60 minutes. Document Released: 10/03/2010 Document Revised: 11/23/2011 Document Reviewed: 10/03/2010 [facility] Patient Information 2015 [place], [place]. This information is not intended to replace advice given to you by your health care provider. Make sure  you discuss any questions you have with your health care provider.

## 2015-01-08 NOTE — Progress Notes (Signed)
   Subjective:    Patient ID: Nancy Watts, female    DOB: 11/02/1965, 49 y.o.   MRN: [id number]  Pt presents to the office today to recheck HTN. Pt's BP is at goal.  Hypertension This is a chronic problem. The current episode started more than 1 year ago. The problem has been resolved since onset. The problem is controlled. Associated symptoms include anxiety and peripheral edema ("swelling in left foot at times"). Pertinent negatives include no headaches, malaise/fatigue, palpitations or shortness of breath. Risk factors for coronary artery disease include dyslipidemia, obesity, sedentary lifestyle and family history. Past treatments include ACE inhibitors and diuretics. The current treatment provides significant improvement. There is no history of kidney disease, CAD/MI, CVA, heart failure or a thyroid problem. There is no history of sleep apnea.      Review of Systems  Constitutional: Negative.  Negative for malaise/fatigue.  HENT: Negative.   Eyes: Negative.   Respiratory: Negative.  Negative for shortness of breath.   Cardiovascular: Negative.  Negative for palpitations.  Gastrointestinal: Negative.   Endocrine: Negative.   Genitourinary: Negative.   Musculoskeletal: Negative.   Neurological: Negative.  Negative for headaches.  Hematological: Negative.   Psychiatric/Behavioral: Negative.   All other systems reviewed and are negative.      Objective:   Physical Exam  Constitutional: She is oriented to person, place, and time. She appears well-developed and well-nourished. No distress.  HENT:  Head: Normocephalic and atraumatic.  Eyes: Pupils are equal, round, and reactive to light.  Neck: Normal range of motion. Neck supple. No thyromegaly present.  Cardiovascular: Normal rate, regular rhythm, normal heart sounds and intact distal pulses.   No murmur heard. Pulmonary/Chest: Effort normal and breath sounds normal. No respiratory distress. She has no wheezes.  Abdominal:  Soft. Bowel sounds are normal. She exhibits no distension. There is no tenderness.  Musculoskeletal: Normal range of motion. She exhibits no edema or tenderness.  Neurological: She is alert and oriented to person, place, and time. She has normal reflexes. No cranial nerve deficit.  Skin: Skin is warm and dry.  Psychiatric: She has a normal mood and affect. Her behavior is normal. Judgment and thought content normal.  Vitals reviewed.     BP 117/86 mmHg  Pulse 75  Temp(Src) 97.5 F (36.4 C) (Oral)  Ht _0  (1.575 m)  Wt 220 lb (99.791 kg)  BMI 40.23 kg/m2  LMP  (Within Months)     Assessment & Plan:  1. Essential hypertension -Dash diet information given -Exercise encouraged - Stress Management  -Continue current meds - BMP8+EGFR  2. Morbid obesity -Encouraged exercise and healthy diet -RTO 2 months - orlistat (XENICAL) 120 MG capsule; Take 1 capsule (120 mg total) by mouth 3 (three) times daily with meals.  Dispense: 90 capsule; Refill: [place], FNP

## 2015-01-09 LAB — BMP8+EGFR
BUN/Creatinine Ratio: 15 (ref 9–23)
BUN: 11 mg/dL (ref 6–24)
CO2: 24 mmol/L (ref 18–29)
Calcium: 9.6 mg/dL (ref 8.7–10.2)
Chloride: 105 mmol/L (ref 97–108)
Creatinine, Ser: 0.72 mg/dL (ref 0.57–1.00)
GFR calc Af Amer: 116 mL/min/{1.73_m2} (ref >59)
GFR calc non Af Amer: 101 mL/min/{1.73_m2} (ref >59)
Glucose: 94 mg/dL (ref 65–99)
Potassium: 4.2 mmol/L (ref 3.5–5.2)
Sodium: 143 mmol/L (ref 134–144)

## 2015-01-23 ENCOUNTER — Other Ambulatory Visit: Payer: Self-pay | Admitting: Family

## 2015-01-23 MED ORDER — SUMATRIPTAN SUCCINATE 100 MG PO TABS: 100.0000 mg | ORAL_TABLET | ORAL | Status: DC | PRN

## 2015-01-23 NOTE — Telephone Encounter (Signed)
RX sent to pharmacy  

## 2015-01-23 NOTE — Telephone Encounter (Signed)
Detailed message left for patient.

## 2015-01-23 NOTE — Telephone Encounter (Signed)
Patient is requesting imitrex and I do not see on medication list

## 2015-03-11 ENCOUNTER — Ambulatory Visit: Payer: Medicare Other | Admitting: Family

## 2015-03-25 ENCOUNTER — Ambulatory Visit (INDEPENDENT_AMBULATORY_CARE_PROVIDER_SITE_OTHER): Payer: Medicare Other | Admitting: Obstetrics & Gynecology

## 2015-03-25 ENCOUNTER — Encounter: Payer: Self-pay | Admitting: Obstetrics & Gynecology

## 2015-03-25 ENCOUNTER — Other Ambulatory Visit (HOSPITAL_COMMUNITY)
Admission: RE | Admit: 2015-03-25 | Discharge: 2015-03-25 | Disposition: A | Discharge status: Home / Self Care | Payer: Medicare Other | Source: Ambulatory Visit | Attending: Obstetrics & Gynecology | Admitting: Obstetrics & Gynecology

## 2015-03-25 VITALS — BP 136/90 | HR 75 | Resp 16 | Ht 62.0 in | Wt 216.0 lb

## 2015-03-25 DIAGNOSIS — Z01419 Encounter for gynecological examination (general) (routine) without abnormal findings: Secondary | ICD-10-CM | POA: Diagnosis not present

## 2015-03-25 DIAGNOSIS — Z1151 Encounter for screening for human papillomavirus (HPV): Secondary | ICD-10-CM | POA: Diagnosis not present

## 2015-03-25 DIAGNOSIS — Z124 Encounter for screening for malignant neoplasm of cervix: Secondary | ICD-10-CM | POA: Diagnosis not present

## 2015-03-25 NOTE — Patient Instructions (Signed)
Menopause Menopause is the normal time of life when menstrual periods stop completely. Menopause is complete when you have missed 12 consecutive menstrual periods. It usually occurs between the ages of 48 years and 55 years. Very rarely does a woman develop menopause before the age of 40 years. At menopause, your ovaries stop producing the female hormones estrogen and progesterone. This can cause undesirable symptoms and also affect your health. Sometimes the symptoms may occur 4-5 years before the menopause begins. There is no relationship between menopause and:  Oral contraceptives.  Number of children you had.  Race.  The age your menstrual periods started (menarche). Heavy smokers and very thin women may develop menopause earlier in life. CAUSES  The ovaries stop producing the female hormones estrogen and progesterone.  Other causes include:  Surgery to remove both ovaries.  The ovaries stop functioning for no known reason.  Tumors of the pituitary gland in the brain.  Medical disease that affects the ovaries and hormone production.  Radiation treatment to the abdomen or pelvis.  Chemotherapy that affects the ovaries. SYMPTOMS   Hot flashes.  Night sweats.  Decrease in sex drive.  Vaginal dryness and thinning of the vagina causing painful intercourse.  Dryness of the skin and developing wrinkles.  Headaches.  Tiredness.  Irritability.  Memory problems.  Weight gain.  Bladder infections.  Hair growth of the face and chest.  Infertility. More serious symptoms include:  Loss of bone (osteoporosis) causing breaks (fractures).  Depression.  Hardening and narrowing of the arteries (atherosclerosis) causing heart attacks and strokes. DIAGNOSIS   When the menstrual periods have stopped for 12 straight months.  Physical exam.  Hormone studies of the blood. TREATMENT  There are many treatment choices and nearly as many questions about them. The  decisions to treat or not to treat menopausal changes is an individual choice made with your health care provider. Your health care provider can discuss the treatments with you. Together, you can decide which treatment will work best for you. Your treatment choices may include:   Hormone therapy (estrogen and progesterone).  Non-hormonal medicines.  Treating the individual symptoms with medicine (for example antidepressants for depression).  Herbal medicines that may help specific symptoms.  Counseling by a psychiatrist or psychologist.  Group therapy.  Lifestyle changes including:  Eating healthy.  Regular exercise.  Limiting caffeine and alcohol.  Stress management and meditation.  No treatment. HOME CARE INSTRUCTIONS   Take the medicine your health care provider gives you as directed.  Get plenty of sleep and rest.  Exercise regularly.  Eat a diet that contains calcium (good for the bones) and soy products (acts like estrogen hormone).  Avoid alcoholic beverages.  Do not smoke.  If you have hot flashes, dress in layers.  Take supplements, calcium, and vitamin D to strengthen bones.  You can use over-the-counter lubricants or moisturizers for vaginal dryness.  Group therapy is sometimes very helpful.  Acupuncture may be helpful in some cases. SEEK MEDICAL CARE IF:   You are not sure you are in menopause.  You are having menopausal symptoms and need advice and treatment.  You are still having menstrual periods after age 55 years.  You have pain with intercourse.  Menopause is complete (no menstrual period for 12 months) and you develop vaginal bleeding.  You need a referral to a specialist (gynecologist, psychiatrist, or psychologist) for treatment. SEEK IMMEDIATE MEDICAL CARE IF:   You have severe depression.  You have excessive vaginal bleeding.    You fell and think you have a broken bone.  You have pain when you urinate.  You develop leg or  chest pain.  You have a fast pounding heart beat (palpitations).  You have severe headaches.  You develop vision problems.  You feel a lump in your breast.  You have abdominal pain or severe indigestion. Document Released: 11/21/2003 Document Revised: 05/03/2013 Document Reviewed: 03/30/2013 [employer] Patient Information 2015 [employer]. This information is not intended to replace advice given to you by your health care provider. Make sure you discuss any questions you have with your health care provider.  

## 2015-03-25 NOTE — Progress Notes (Signed)
Patient ID: Nancy MediaFlorence D Shein, female   DOB: 11-03-1965, 49 y.o.   MRN: [id number]  Chief Complaint  Patient presents with  . Gynecologic Exam    abnormal leeding   Last bleeding was 7-8 months ago HPI Nancy Watts is a 49 y.o. female.  G4P3 Patient's last menstrual period was 08/24/2014. H/o menorrhagia and had ablation.Has VMS now and suspects menopause.  HPI  Past Medical History  Diagnosis Date  . Anemia   . Hemorrhage   . Obesity   . Pinched nerve     L4  . Fibromyalgia   . Migraines   . Pulmonary emboli     x5  . History of blood transfusion   . Abnormal Pap smear of cervix     Past Surgical History  Procedure Laterality Date  . Cholecystectomy    . Appendectomy    . Dilation and curettage of uterus    . Cardiac catheterization    . Uterine ablation      Family History  Problem Relation Age of Onset  . COPD Mother   . Hypertension Mother   . Hyperlipidemia Mother   . Cancer Father     BONE  . Hypertension Father     Social History History  Substance Use Topics  . Smoking status: Never Smoker   . Smokeless tobacco: Never Used  . Alcohol Use: No    Allergies  Allergen Reactions  . Codeine   . Eggs Or Egg-Derived Products     Eggs  . Other     Ct dye questionable iodine    Current Outpatient Prescriptions  Medication Sig Dispense Refill  . aspirin 325 MG EC tablet Take 325 mg by mouth daily.    Marland Kitchen. atorvastatin (LIPITOR) 40 MG tablet Take 1 tablet (40 mg total) by mouth daily. 90 tablet 3  . gabapentin (NEURONTIN) 100 MG capsule Take 1 capsule (100 mg total) by mouth 2 (two) times daily. 180 capsule 4  . lisinopril (PRINIVIL,ZESTRIL) 20 MG tablet Take 1 tablet (20 mg total) by mouth daily. 90 tablet 3  . Melatonin 1 MG TABS Take 1 mg by mouth daily.    . Multiple Vitamins-Minerals (WOMENS MULTIVITAMIN PLUS PO) Take 1 tablet by mouth daily.    Marland Kitchen. orlistat (XENICAL) 120 MG capsule Take 1 capsule (120 mg total) by mouth 3 (three) times daily  with meals. 90 capsule 1  . Probiotic Product (PROBIOTIC DAILY PO) Take 1 tablet by mouth daily.    . psyllium (METAMUCIL) 58.6 % powder Take 1 packet by mouth daily.    . SUMAtriptan (IMITREX) 100 MG tablet Take 1 tablet (100 mg total) by mouth every 2 (two) hours as needed for migraine. May repeat in 2 hours if headache persists or recurs. 10 tablet 1  . Vitamin D, Ergocalciferol, (DRISDOL) 50000 UNITS CAPS capsule Take 1 capsule (50,000 Units total) by mouth every 7 (seven) days. 12 capsule 3   No current facility-administered medications for this visit.    Review of Systems Review of Systems  Constitutional: Negative.   Respiratory: Negative.   Cardiovascular: Negative.   Gastrointestinal: Negative.   Endocrine:       Hot flushes  Genitourinary: Negative.   Hematological: Negative.     Blood pressure 136/90, pulse 75, resp. rate 16, height 5\' 2"  (1.575 m), weight 216 lb (97.977 kg), last menstrual period 08/24/2014.  Physical Exam Physical Exam  Constitutional: She is oriented to person, place, and time. She appears well-developed. No distress.  obese  Cardiovascular: Normal rate.   Pulmonary/Chest: Effort normal and breath sounds normal.  Abdominal: Soft. She exhibits no distension. There is no tenderness.  Genitourinary: Vagina normal and uterus normal. No vaginal discharge found.  Pap done, no mass  Neurological: She is alert and oriented to person, place, and time.  Skin: Skin is warm and dry.  Psychiatric: She has a normal mood and affect. Her behavior is normal.    Data Reviewed Office notes [facility]  Assessment    Menopause Well woman exam, nl exam     Plan    Yearly exam, pap in 3-5 years if nl today Mammogram yearly Weight loss        ARNOLD,JAMES 03/25/2015, 10:24 AM

## 2015-03-26 ENCOUNTER — Encounter: Payer: Self-pay | Admitting: Physician Assistant

## 2015-03-26 ENCOUNTER — Ambulatory Visit (INDEPENDENT_AMBULATORY_CARE_PROVIDER_SITE_OTHER): Payer: Medicare Other | Admitting: Physician Assistant

## 2015-03-26 MED ORDER — ORLISTAT 120 MG PO CAPS: 120.0000 mg | ORAL_CAPSULE | Freq: Three times a day (TID) | ORAL | Status: DC

## 2015-03-26 NOTE — Progress Notes (Signed)
   Subjective:    Patient ID: Nancy Watts, female    DOB: Jun 08, 1966, 49 y.o.   MRN: [id number]  HPI 49 y/o female presents for 2 month follow up for weight loss and discussion of other options for weight loss. She has been taking Alli with 2 lb weight loss since April. She is taking care of her Mom full time at home and is under significant stress at this time. Her BP is elevated today.  She is unable to exercise.     Review of Systems  Constitutional: Negative.   Eyes: Negative.   Respiratory: Negative.   Cardiovascular: Negative.   Gastrointestinal: Negative.   Endocrine: Negative.   Genitourinary: Negative.   Musculoskeletal: Negative.   Skin: Negative.   Allergic/Immunologic: Negative.   Neurological: Positive for headaches (occasional ).  Hematological: Negative.   Psychiatric/Behavioral: The patient is nervous/anxious.        Objective:   Physical Exam  Constitutional: She is oriented to person, place, and time. She appears well-developed and well-nourished. No distress.  HENT:  Head: Normocephalic.  Cardiovascular:  Stage 1 Htn - elevated BP today   Musculoskeletal: She exhibits no edema or tenderness.  Chronic contraction of big toe on bilateral feet  Neurological: She is alert and oriented to person, place, and time.  Skin: She is not diaphoretic.  Psychiatric: She has a normal mood and affect. Her behavior is normal. Judgment and thought content normal.  Nursing note and vitals reviewed.         Assessment & Plan:  1. Morbid obesity  - orlistat (XENICAL) 120 MG capsule; Take 1 capsule (120 mg total) by mouth 3 (three) times daily with meals.  Dispense: 90 capsule; Refill: 1  Strongly encouraged exercise at least 3 times a week for 20 minutes to increase weight loss. She will follow up in 1-2 months.   Tiffany A. Chauncey ReadingGann PA-C

## 2015-03-27 LAB — CYTOLOGY - PAP

## 2015-03-28 ENCOUNTER — Encounter: Payer: Medicare Other | Admitting: Family Medicine

## 2015-04-03 ENCOUNTER — Encounter: Payer: Self-pay | Admitting: *Deleted

## 2015-06-20 ENCOUNTER — Ambulatory Visit (INDEPENDENT_AMBULATORY_CARE_PROVIDER_SITE_OTHER): Payer: Medicare Other | Admitting: Family

## 2015-06-20 ENCOUNTER — Encounter: Payer: Self-pay | Admitting: Family

## 2015-06-20 VITALS — BP 111/77 | HR 78 | Temp 97.3°F | Ht 62.0 in | Wt 224.0 lb

## 2015-06-20 DIAGNOSIS — M545 Low back pain: Secondary | ICD-10-CM

## 2015-06-20 DIAGNOSIS — G6289 Other specified polyneuropathies: Secondary | ICD-10-CM | POA: Diagnosis not present

## 2015-06-20 DIAGNOSIS — G43009 Migraine without aura, not intractable, without status migrainosus: Secondary | ICD-10-CM

## 2015-06-20 DIAGNOSIS — M5137 Other intervertebral disc degeneration, lumbosacral region: Secondary | ICD-10-CM | POA: Diagnosis not present

## 2015-06-20 DIAGNOSIS — R200 Anesthesia of skin: Secondary | ICD-10-CM

## 2015-06-20 DIAGNOSIS — G8929 Other chronic pain: Secondary | ICD-10-CM

## 2015-06-20 DIAGNOSIS — R208 Other disturbances of skin sensation: Secondary | ICD-10-CM | POA: Diagnosis not present

## 2015-06-20 MED ORDER — METHYLPREDNISOLONE 4 MG PO TBPK: ORAL_TABLET | ORAL | Status: DC

## 2015-06-20 MED ORDER — CYCLOBENZAPRINE HCL 10 MG PO TABS: 10.0000 mg | ORAL_TABLET | Freq: Three times a day (TID) | ORAL | Status: DC | PRN

## 2015-06-20 NOTE — Progress Notes (Signed)
Subjective:    Patient ID: Nancy Watts, female    DOB: 05-21-1966, 49 y.o.   MRN: [id number]  Back Pain This is a chronic problem. The current episode started more than 1 year ago. The problem occurs constantly. The problem is unchanged. The pain is present in the lumbar spine. The quality of the pain is described as aching. The pain radiates to the left thigh. The pain is at a severity of 8/10. The pain is moderate. The symptoms are aggravated by bending, position, lying down, sitting, twisting and stress. Associated symptoms include leg pain, numbness, tingling and weakness. Pertinent negatives include no bladder incontinence, bowel incontinence, dysuria or headaches. (Left leg numbness with coldness) Treatments tried: gabapentin. The treatment provided mild relief.  Migraine  This is a chronic problem. The current episode started more than 1 year ago. The problem occurs intermittently. The problem has been waxing and waning. The pain is located in the temporal region. The pain does not radiate. The pain quality is not similar to prior headaches. The quality of the pain is described as pulsating. The pain is at a severity of 10/10. The pain is moderate. Associated symptoms include back pain, eye pain, numbness, phonophobia, photophobia, scalp tenderness, tingling and weakness. Pertinent negatives include no ear pain, seizures, sinus pressure, sore throat or visual change. Nothing aggravates the symptoms. She has tried triptans and oxygen for the symptoms. The treatment provided moderate relief. Her past medical history is significant for migraine headaches and migraines in the family.      Review of Systems  HENT: Negative.  Negative for ear pain, sinus pressure and sore throat.   Eyes: Positive for photophobia and pain.  Respiratory: Negative.  Negative for shortness of breath.   Cardiovascular: Negative.  Negative for palpitations.  Gastrointestinal: Negative.  Negative for bowel  incontinence.  Endocrine: Negative.   Genitourinary: Negative.  Negative for bladder incontinence and dysuria.  Musculoskeletal: Positive for back pain.  Neurological: Positive for tingling, weakness and numbness. Negative for seizures and headaches.  Hematological: Negative.   Psychiatric/Behavioral: Negative.   All other systems reviewed and are negative.      Objective:   Physical Exam  Constitutional: She is oriented to person, place, and time. She appears well-developed and well-nourished. No distress.  HENT:  Head: Normocephalic and atraumatic.  Eyes: Pupils are equal, round, and reactive to light.  Neck: Normal range of motion. Neck supple. No thyromegaly present.  Cardiovascular: Normal rate, regular rhythm, normal heart sounds and intact distal pulses.   No murmur heard. Pulmonary/Chest: Effort normal and breath sounds normal. No respiratory distress. She has no wheezes.  Abdominal: Soft. Bowel sounds are normal. She exhibits no distension. There is no tenderness.  Musculoskeletal: Normal range of motion. She exhibits no edema or tenderness.  Full ROM of back, but slow movements related to pain   Neurological: She is alert and oriented to person, place, and time. She has normal reflexes. No cranial nerve deficit.  Skin: Skin is warm and dry.  Psychiatric: She has a normal mood and affect. Her behavior is normal. Judgment and thought content normal.  Vitals reviewed.   BP 111/77 mmHg  Pulse 78  Temp(Src) 97.3 F (36.3 C) (Oral)  Ht '5\' 2"'  (1.575 m)  Wt 224 lb (101.606 kg)  BMI 40.96 kg/m2  LMP 08/24/2014       Assessment & Plan:  1. Other polyneuropathy ([place]) - CMP14+EGFR - CBC with Differential/Platelet  2. Degeneration of lumbar or lumbosacral  intervertebral disc - Ambulatory referral to Orthopedic Surgery - CMP14+EGFR - CBC with Differential/Platelet - cyclobenzaprine (FLEXERIL) 10 MG tablet; Take 1 tablet (10 mg total) by mouth 3 (three) times daily as  needed for muscle spasms.  Dispense: 60 tablet; Refill: 2  3. Chronic lower back pain - Ambulatory referral to Orthopedic Surgery - MR Lumbar Spine Wo Contrast; Future - CMP14+EGFR - CBC with Differential/Platelet - cyclobenzaprine (FLEXERIL) 10 MG tablet; Take 1 tablet (10 mg total) by mouth 3 (three) times daily as needed for muscle spasms.  Dispense: 60 tablet; Refill: 2 - methylPREDNISolone (MEDROL DOSEPAK) 4 MG TBPK tablet; Use as directed  Dispense: 21 tablet; Refill: 0  4. Migraine without aura and without status migrainosus, not intractable -Stress management  -Avoid caffeine - Get plenty of sleep - Sedimentation rate - CMP14+EGFR - CBC with Differential/Platelet - methylPREDNISolone (MEDROL DOSEPAK) 4 MG TBPK tablet; Use as directed  Dispense: 21 tablet; Refill: 0  5. Left leg numbness -If leg numbness becomes worse or bowel or bladder incontinence go to ED  - MR Lumbar Spine Wo Contrast; Future - CMP14+EGFR - CBC with Differential/Platelet   Continue all meds Labs pending Health Maintenance reviewed Diet and exercise encouraged RTO 2 weeks  Evelina Dun, FNP

## 2015-06-20 NOTE — Patient Instructions (Signed)
Chronic Back Pain  When back pain lasts longer than 3 months, it is called chronic back pain.People with chronic back pain often go through certain periods that are more intense (flare-ups).  CAUSES Chronic back pain can be caused by wear and tear (degeneration) on different structures in your back. These structures include:  The bones of your spine (vertebrae) and the joints surrounding your spinal cord and nerve roots (facets).  The strong, fibrous tissues that connect your vertebrae (ligaments). Degeneration of these structures may result in pressure on your nerves. This can lead to constant pain. HOME CARE INSTRUCTIONS  Avoid bending, heavy lifting, prolonged sitting, and activities which make the problem worse.  Take brief periods of rest throughout the day to reduce your pain. Lying down or standing usually is better than sitting while you are resting.  Take over-the-counter or prescription medicines only as directed by your caregiver. SEEK IMMEDIATE MEDICAL CARE IF:   You have weakness or numbness in one of your legs or feet.  You have trouble controlling your bladder or bowels.  You have nausea, vomiting, abdominal pain, shortness of breath, or fainting.   This information is not intended to replace advice given to you by your health care provider. Make sure you discuss any questions you have with your health care provider.   Document Released: 10/08/2004 Document Revised: 11/23/2011 Document Reviewed: 02/18/2015 Elsevier Interactive Patient Education 2016 [employer]. Migraine Headache A migraine headache is an intense, throbbing pain on one or both sides of your head. A migraine can last for 30 minutes to several hours. CAUSES  The exact cause of a migraine headache is not always known. However, a migraine may be caused when nerves in the brain become irritated and release chemicals that cause inflammation. This causes pain. Certain things may also trigger migraines, such  as:  Alcohol.  Smoking.  Stress.  Menstruation.  Aged cheeses.  Foods or drinks that contain nitrates, glutamate, aspartame, or tyramine.  Lack of sleep.  Chocolate.  Caffeine.  Hunger.  Physical exertion.  Fatigue.  Medicines used to treat chest pain (nitroglycerine), birth control pills, estrogen, and some blood pressure medicines. SIGNS AND SYMPTOMS  Pain on one or both sides of your head.  Pulsating or throbbing pain.  Severe pain that prevents daily activities.  Pain that is aggravated by any physical activity.  Nausea, vomiting, or both.  Dizziness.  Pain with exposure to bright lights, loud noises, or activity.  General sensitivity to bright lights, loud noises, or smells. Before you get a migraine, you may get warning signs that a migraine is coming (aura). An aura may include:  Seeing flashing lights.  Seeing bright spots, halos, or zigzag lines.  Having tunnel vision or blurred vision.  Having feelings of numbness or tingling.  Having trouble talking.  Having muscle weakness. DIAGNOSIS  A migraine headache is often diagnosed based on:  Symptoms.  Physical exam.  A CT scan or MRI of your head. These imaging tests cannot diagnose migraines, but they can help rule out other causes of headaches. TREATMENT Medicines may be given for pain and nausea. Medicines can also be given to help prevent recurrent migraines.  HOME CARE INSTRUCTIONS  Only take over-the-counter or prescription medicines for pain or discomfort as directed by your health care provider. The use of long-term narcotics is not recommended.  Lie down in a dark, quiet room when you have a migraine.  Keep a journal to find out what may trigger your migraine headaches.  For example, write down:  What you eat and drink.  How much sleep you get.  Any change to your diet or medicines.  Limit alcohol consumption.  Quit smoking if you smoke.  Get 7-9 hours of sleep, or as  recommended by your health care provider.  Limit stress.  Keep lights dim if bright lights bother you and make your migraines worse. SEEK IMMEDIATE MEDICAL CARE IF:   Your migraine becomes severe.  You have a fever.  You have a stiff neck.  You have vision loss.  You have muscular weakness or loss of muscle control.  You start losing your balance or have trouble walking.  You feel faint or pass out.  You have severe symptoms that are different from your first symptoms. MAKE SURE YOU:   Understand these instructions.  Will watch your condition.  Will get help right away if you are not doing well or get worse.   This information is not intended to replace advice given to you by your health care provider. Make sure you discuss any questions you have with your health care provider.   Document Released: 08/31/2005 Document Revised: 09/21/2014 Document Reviewed: 05/08/2013 Elsevier Interactive Patient Education [employer]

## 2015-06-21 LAB — CBC WITH DIFFERENTIAL/PLATELET
Basophils Absolute: 0.1 10*3/uL (ref 0.0–0.2)
Basos: 1 %
EOS (ABSOLUTE): 0.4 10*3/uL (ref 0.0–0.4)
Eos: 6 %
Hematocrit: 39.9 % (ref 34.0–46.6)
Hemoglobin: 13.3 g/dL (ref 11.1–15.9)
Immature Grans (Abs): 0 10*3/uL (ref 0.0–0.1)
Immature Granulocytes: 0 %
Lymphocytes Absolute: 2.6 10*3/uL (ref 0.7–3.1)
Lymphs: 35 %
MCH: 31 pg (ref 26.6–33.0)
MCHC: 33.3 g/dL (ref 31.5–35.7)
MCV: 93 fL (ref 79–97)
Monocytes Absolute: 0.6 10*3/uL (ref 0.1–0.9)
Monocytes: 8 %
Neutrophils Absolute: 3.7 10*3/uL (ref 1.4–7.0)
Neutrophils: 50 %
Platelets: 286 10*3/uL (ref 150–379)
RBC: 4.29 x10E6/uL (ref 3.77–5.28)
RDW: 13 % (ref 12.3–15.4)
WBC: 7.4 10*3/uL (ref 3.4–10.8)

## 2015-06-21 LAB — CMP14+EGFR
ALT: 21 IU/L (ref 0–32)
AST: 17 IU/L (ref 0–40)
Albumin/Globulin Ratio: 1.5 (ref 1.1–2.5)
Albumin: 4.1 g/dL (ref 3.5–5.5)
Alkaline Phosphatase: 66 IU/L (ref 39–117)
BUN/Creatinine Ratio: 21 (ref 9–23)
BUN: 15 mg/dL (ref 6–24)
Bilirubin Total: 0.6 mg/dL (ref 0.0–1.2)
CO2: 21 mmol/L (ref 18–29)
Calcium: 9.3 mg/dL (ref 8.7–10.2)
Chloride: 101 mmol/L (ref 97–108)
Creatinine, Ser: 0.71 mg/dL (ref 0.57–1.00)
GFR calc Af Amer: 117 mL/min/{1.73_m2} (ref >59)
GFR calc non Af Amer: 102 mL/min/{1.73_m2} (ref >59)
Globulin, Total: 2.7 g/dL (ref 1.5–4.5)
Glucose: 115 mg/dL — ABNORMAL HIGH (ref 65–99)
Potassium: 3.7 mmol/L (ref 3.5–5.2)
Sodium: 141 mmol/L (ref 134–144)
Total Protein: 6.8 g/dL (ref 6.0–8.5)

## 2015-06-21 LAB — SEDIMENTATION RATE: Sed Rate: 2 mm/hr (ref 0–32)

## 2015-07-15 ENCOUNTER — Ambulatory Visit (HOSPITAL_COMMUNITY)
Admission: RE | Admit: 2015-07-15 | Discharge: 2015-07-15 | Disposition: A | Discharge status: Home / Self Care | Payer: Medicare Other | Source: Ambulatory Visit | Attending: Family | Admitting: Family

## 2015-07-15 DIAGNOSIS — M899 Disorder of bone, unspecified: Secondary | ICD-10-CM | POA: Insufficient documentation

## 2015-07-15 DIAGNOSIS — G8929 Other chronic pain: Secondary | ICD-10-CM | POA: Insufficient documentation

## 2015-07-15 DIAGNOSIS — M5137 Other intervertebral disc degeneration, lumbosacral region: Secondary | ICD-10-CM | POA: Insufficient documentation

## 2015-07-15 DIAGNOSIS — M5136 Other intervertebral disc degeneration, lumbar region: Secondary | ICD-10-CM | POA: Diagnosis not present

## 2015-07-15 DIAGNOSIS — R200 Anesthesia of skin: Secondary | ICD-10-CM

## 2015-07-15 DIAGNOSIS — M4806 Spinal stenosis, lumbar region: Secondary | ICD-10-CM | POA: Diagnosis not present

## 2015-07-15 DIAGNOSIS — M545 Low back pain: Secondary | ICD-10-CM | POA: Insufficient documentation

## 2015-07-18 ENCOUNTER — Other Ambulatory Visit: Payer: Self-pay | Admitting: Family

## 2015-07-31 ENCOUNTER — Telehealth: Payer: Self-pay | Admitting: Family

## 2015-08-01 ENCOUNTER — Telehealth: Payer: Self-pay | Admitting: Family

## 2015-08-01 NOTE — Telephone Encounter (Signed)
Stp and advised she needs to follow up with ortho and pt is aware she needs to get her records from her previous ortho before we can schedule an appt. Pt voiced understanding.

## 2015-08-05 NOTE — Telephone Encounter (Signed)
LMTCM/jb

## 2015-08-14 NOTE — Telephone Encounter (Signed)
We did not have a release on file for pt.Pt came in 08/12/2015 and signed release to get records transferred here.

## 2015-09-26 ENCOUNTER — Encounter: Payer: Self-pay | Admitting: Family

## 2015-09-27 ENCOUNTER — Encounter: Payer: Self-pay | Admitting: Family

## 2015-10-21 ENCOUNTER — Ambulatory Visit (INDEPENDENT_AMBULATORY_CARE_PROVIDER_SITE_OTHER): Payer: Medicare Other | Admitting: Family Medicine

## 2015-10-21 VITALS — BP 154/100 | HR 79 | Temp 97.4°F | Ht 62.0 in | Wt 225.0 lb

## 2015-10-21 DIAGNOSIS — L739 Follicular disorder, unspecified: Secondary | ICD-10-CM | POA: Diagnosis not present

## 2015-10-21 DIAGNOSIS — R079 Chest pain, unspecified: Secondary | ICD-10-CM | POA: Diagnosis not present

## 2015-10-21 MED ORDER — CEPHALEXIN 500 MG PO CAPS: 500.0000 mg | ORAL_CAPSULE | Freq: Three times a day (TID) | ORAL | Status: DC

## 2015-10-21 NOTE — Patient Instructions (Addendum)
   Pulmonary embolism can lead to death. Your shortness of breath that is similar to your previous PE causes me to recommend going to the emergency room for evaluation, My medical opinion is to send you by ambulance.   I am treating you for folliculitis with keflex, an antibiotic Come back for follow up in 2 weeks

## 2015-10-21 NOTE — Progress Notes (Signed)
   HPI  Patient presents today here with several complaints, primarily a rash but then mentions chest pain.  Chest pain present for several weeks, patient will not go straight to ED and declines Ambulance  Describes central chest pain with generalized shortness of breath, she states that this is very similar to previous chest pain that she developed a pulmonary embolism. She describes 3 weeks of this chest pain Describes a heaviness type pain and shortness of breath which both get worse with lying down at night She does have improvement with symptoms with lying on pillows. She is insistent that it's similar to the previous pain that she had with pulmonary embolism, this was due to estrogen containing birth control and she has not been anticoagulated in over 2 years. She does describe calf discomfort recently   Papules in the hairline Describes itchy rash in her hairline the last 5 days. She describes that she's been under much more stress than usual and has attributed to this, she scratches her head routinely. She describes multiple itchy red bumps She's tried alcohol, Neosporin with no relief.   PMH: Smoking status noted ROS: Per HPI  Objective: BP 154/100 mmHg  Pulse 79  Temp(Src) 97.4 F (36.3 C) (Oral)  Ht  (1.575 m)  Wt 225 lb (102.059 kg)  BMI 41.14 kg/m2  LMP 08/24/2014 Gen: NAD, alert, cooperative with exam HEENT: NCAT CV: RRR, good S1/S2, no murmur Resp: CTABL, no wheezes, non-labored Ext: No edema, warm Neuro: Alert and oriented, No gross deficits  Calf circumference Measurement 43 cm on the left, 42 cm on the right  Skin: Along her posterior hairline she has several small papules ranging from 3-7 mm in diameter, erythematous, no areas of fluctuance or drainage.   Assessment and plan:  # Chest pain History of pulmonary embolism with similar pain currently. She does not have tachycardia, any calf swelling, or characteristic pain currently. Considering  her history I think it is very reasonable to get evaluated, I offered and encouraged her to get evaluated immediately that she declines. She states that she will seek emergency medical care later tonight. This is been going on several weeks, I discussed the seriousness of this diagnosis  # Folliculitis Treat with Keflex Possibly due to Repetitive scratching at the hairline Follow-up 2 weeks for folliculitis  Again, I encouraged her and recommended emergency medical evaluation, however she declines. I honestly think that her chance of having a pulmonary embolism is low however considering her history I think it's very prudent to get evaluated right away   Meds ordered this encounter  Medications  . cephALEXin (KEFLEX) 500 MG capsule    Sig: Take 1 capsule (500 mg total) by mouth 3 (three) times daily.    Dispense:  21 capsule    Refill:  0    Murtis Sink, MD [facility] 10/21/2015, 7:16 PM

## 2015-10-25 ENCOUNTER — Emergency Department (HOSPITAL_COMMUNITY)
Admission: EM | Admit: 2015-10-25 | Discharge: 2015-10-25 | Disposition: A | Discharge status: Home / Self Care | Payer: Medicare Other | Attending: Emergency Medicine | Admitting: Emergency Medicine

## 2015-10-25 ENCOUNTER — Encounter (HOSPITAL_COMMUNITY): Payer: Self-pay

## 2015-10-25 ENCOUNTER — Emergency Department (HOSPITAL_COMMUNITY): Payer: Medicare Other

## 2015-10-25 DIAGNOSIS — G43909 Migraine, unspecified, not intractable, without status migrainosus: Secondary | ICD-10-CM | POA: Diagnosis not present

## 2015-10-25 DIAGNOSIS — R0789 Other chest pain: Secondary | ICD-10-CM

## 2015-10-25 DIAGNOSIS — Z862 Personal history of diseases of the blood and blood-forming organs and certain disorders involving the immune mechanism: Secondary | ICD-10-CM | POA: Insufficient documentation

## 2015-10-25 DIAGNOSIS — Z79899 Other long term (current) drug therapy: Secondary | ICD-10-CM | POA: Insufficient documentation

## 2015-10-25 DIAGNOSIS — Z86711 Personal history of pulmonary embolism: Secondary | ICD-10-CM | POA: Insufficient documentation

## 2015-10-25 DIAGNOSIS — Z792 Long term (current) use of antibiotics: Secondary | ICD-10-CM | POA: Insufficient documentation

## 2015-10-25 DIAGNOSIS — Z9889 Other specified postprocedural states: Secondary | ICD-10-CM | POA: Diagnosis not present

## 2015-10-25 DIAGNOSIS — R079 Chest pain, unspecified: Secondary | ICD-10-CM | POA: Diagnosis present

## 2015-10-25 DIAGNOSIS — M797 Fibromyalgia: Secondary | ICD-10-CM | POA: Diagnosis not present

## 2015-10-25 DIAGNOSIS — E669 Obesity, unspecified: Secondary | ICD-10-CM | POA: Insufficient documentation

## 2015-10-25 DIAGNOSIS — R0602 Shortness of breath: Secondary | ICD-10-CM | POA: Insufficient documentation

## 2015-10-25 LAB — TROPONIN I
Troponin I: <0.03 ng/mL (ref <0.031)
Troponin I: <0.03 ng/mL (ref <0.031)

## 2015-10-25 LAB — CBC WITH DIFFERENTIAL/PLATELET
Basophils Absolute: 0 10*3/uL (ref 0.0–0.1)
Basophils Relative: 1 %
Eosinophils Absolute: 0.4 10*3/uL (ref 0.0–0.7)
Eosinophils Relative: 4 %
HCT: 41.4 % (ref 36.0–46.0)
Hemoglobin: 14.2 g/dL (ref 12.0–15.0)
Lymphocytes Relative: 22 %
Lymphs Abs: 1.9 10*3/uL (ref 0.7–4.0)
MCH: 31.3 pg (ref 26.0–34.0)
MCHC: 34.3 g/dL (ref 30.0–36.0)
MCV: 91.2 fL (ref 78.0–100.0)
Monocytes Absolute: 0.6 10*3/uL (ref 0.1–1.0)
Monocytes Relative: 7 %
Neutro Abs: 5.8 10*3/uL (ref 1.7–7.7)
Neutrophils Relative %: 66 %
Platelets: 219 10*3/uL (ref 150–400)
RBC: 4.54 MIL/uL (ref 3.87–5.11)
RDW: 13 % (ref 11.5–15.5)
WBC: 8.7 10*3/uL (ref 4.0–10.5)

## 2015-10-25 LAB — BASIC METABOLIC PANEL
Anion gap: 8 (ref 5–15)
BUN: 11 mg/dL (ref 6–20)
CO2: 23 mmol/L (ref 22–32)
Calcium: 9.2 mg/dL (ref 8.9–10.3)
Chloride: 105 mmol/L (ref 101–111)
Creatinine, Ser: 0.71 mg/dL (ref 0.44–1.00)
GFR calc Af Amer: >60 mL/min (ref >60)
GFR calc non Af Amer: >60 mL/min (ref >60)
Glucose, Bld: 98 mg/dL (ref 65–99)
Potassium: 4.3 mmol/L (ref 3.5–5.1)
Sodium: 136 mmol/L (ref 135–145)

## 2015-10-25 LAB — D-DIMER, QUANTITATIVE: D-Dimer, Quant: 0.41 ug/mL-FEU (ref 0.00–0.50)

## 2015-10-25 MED ORDER — DIAZEPAM 5 MG PO TABS: 5.0000 mg | ORAL_TABLET | Freq: Once | ORAL | Status: DC

## 2015-10-25 MED ORDER — MORPHINE SULFATE (PF) 4 MG/ML IV SOLN: 8.0000 mg | Freq: Once | INTRAVENOUS | Status: DC

## 2015-10-25 MED ORDER — HYDROCODONE-ACETAMINOPHEN 5-325 MG PO TABS
2.0000 | ORAL_TABLET | Freq: Once | ORAL | Status: DC
Start: 2015-10-25 — End: 2015-10-25
  Filled 2015-10-25: qty 2

## 2015-10-25 MED ORDER — PROCHLORPERAZINE MALEATE 5 MG PO TABS
5.0000 mg | ORAL_TABLET | Freq: Once | ORAL | Status: DC
  Filled 2015-10-25: qty 1

## 2015-10-25 MED ORDER — KETOROLAC TROMETHAMINE 60 MG/2ML IM SOLN: 60.0000 mg | Freq: Once | INTRAMUSCULAR | Status: DC

## 2015-10-25 MED ORDER — ONDANSETRON HCL 4 MG PO TABS: 4.0000 mg | ORAL_TABLET | Freq: Once | ORAL | Status: DC

## 2015-10-25 NOTE — ED Notes (Signed)
Meal provided for pt who states that she is ready to go and that she is hungry. Reassurances and active listening

## 2015-10-25 NOTE — ED Notes (Signed)
Pt discharged per instruct. She verbalizes understanding of her diagnosis', followup and medication continuation. She is conversant wiithout shortness of breath, laughing and ambulates heel to toe to exit

## 2015-10-25 NOTE — Discharge Instructions (Signed)
Your cardiac enzymes and your blood tests for pulmonary embolism are negative. Your chest x-ray is nonacute. Chest Wall Pain Chest wall pain is pain in or around the bones and muscles of your chest. Sometimes, an injury causes this pain. Sometimes, the cause may not be known. This pain may take several weeks or longer to get better. HOME CARE Pay attention to any changes in your symptoms. Take these actions to help with your pain:  Rest as told by your doctor.  Avoid activities that cause pain. Try not to use your chest, belly (abdominal), or side muscles to lift heavy things.  If directed, apply ice to the painful area:  Put ice in a plastic bag.  Place a towel between your skin and the bag.  Leave the ice on for 20 minutes, 2-3 times per day.  Take over-the-counter and prescription medicines only as told by your doctor.  Do not use tobacco products, including cigarettes, chewing tobacco, and e-cigarettes. If you need help quitting, ask your doctor.  Keep all follow-up visits as told by your doctor. This is important. GET HELP IF:  You have a fever.  Your chest pain gets worse.  You have new symptoms. GET HELP RIGHT AWAY IF:  You feel sick to your stomach (nauseous) or you throw up (vomit).  You feel sweaty or light-headed.  You have a cough with phlegm (sputum) or you cough up blood.  You are short of breath.   This information is not intended to replace advice given to you by your health care provider. Make sure you discuss any questions you have with your health care provider.   Document Released: 02/17/2008 Document Revised: 05/22/2015 Document Reviewed: 11/26/2014 Elsevier Interactive Patient Education 2016 [employer]vier Inc[employer]  Please see Dr. Ermalinda Memos for additional evaluation as an outpatient. Please return to the emergency department if any changes, problems, or concerns.

## 2015-10-25 NOTE — ED Provider Notes (Signed)
CSN: [id number]     Arrival date & time 10/25/15  1914 History   First MD Initiated Contact with Patient 10/25/15 [phone]     Chief Complaint  Patient presents with  . Chest Pain  . Shortness of Breath     (Consider location/radiation/quality/duration/timing/severity/associated sxs/prior Treatment) HPI Comments: Pt is a 50 y/o female who present to the ED with c/o mid to left chest pain that she describes as similar to previous PE pain. Pt denies any recent injury or operations. No prolong periods of inactivity. She does not smoke,and denies BC meds. She describes some SOB present. No hemoptysis. No fever. She has some swelling of the lower extremities from time to time, but none recently. No recent changes in medications. This episode stated last night. Pt was previously on coumadin, but no longer on anticoags. She does take aspirin.  Patient is a 50 y.o. female presenting with chest pain and shortness of breath. The history is provided by the patient.  Chest Pain Associated symptoms: shortness of breath   Associated symptoms: no abdominal pain, no back pain, no cough, no dizziness and no palpitations   Shortness of Breath Associated symptoms: chest pain   Associated symptoms: no abdominal pain, no cough, no neck pain and no wheezing     Past Medical History  Diagnosis Date  . Anemia   . Hemorrhage   . Obesity   . Pinched nerve     L4  . Fibromyalgia   . Migraines   . Pulmonary emboli (HCC)     x5  . History of blood transfusion   . Abnormal Pap smear of cervix    Past Surgical History  Procedure Laterality Date  . Cholecystectomy    . Appendectomy    . Dilation and curettage of uterus    . Cardiac catheterization    . Uterine ablation     Family History  Problem Relation Age of Onset  . COPD Mother   . Hypertension Mother   . Hyperlipidemia Mother   . Cancer Father     BONE  . Hypertension Father    Social History  Substance Use Topics  . Smoking status: Never  Smoker   . Smokeless tobacco: Never Used  . Alcohol Use: No   OB History    Gravida Para Term Preterm AB TAB SAB Ectopic Multiple Living   4 3       1 3      Review of Systems  Constitutional: Negative for activity change.       All ROS Neg except as noted in HPI  HENT: Negative for nosebleeds.   Eyes: Negative for photophobia and discharge.  Respiratory: Positive for shortness of breath. Negative for cough, choking, wheezing and stridor.   Cardiovascular: Positive for chest pain. Negative for palpitations and leg swelling.  Gastrointestinal: Negative for abdominal pain and blood in stool.  Genitourinary: Negative for dysuria, frequency and hematuria.  Musculoskeletal: Negative for back pain, arthralgias and neck pain.  Skin: Negative.   Neurological: Negative for dizziness, seizures and speech difficulty.  Psychiatric/Behavioral: Negative for hallucinations and confusion.  All other systems reviewed and are negative.     Allergies  Codeine; Eggs or egg-derived products; and Other  Home Medications   Prior to Admission medications   Medication Sig Start Date End Date Taking? Authorizing Provider  aspirin 325 MG EC tablet Take 325 mg by mouth daily.    Historical Provider, MD  atorvastatin (LIPITOR) 40 MG tablet Take 1 tablet (  40 mg total) by mouth daily. 11/16/14   Junie Spencer, FNP  cephALEXin (KEFLEX) 500 MG capsule Take 1 capsule (500 mg total) by mouth 3 (three) times daily. 10/21/15   Elenora Gamma, MD  cyclobenzaprine (FLEXERIL) 10 MG tablet Take 1 tablet (10 mg total) by mouth 3 (three) times daily as needed for muscle spasms. 06/20/15   Junie Spencer, FNP  gabapentin (NEURONTIN) 100 MG capsule Take 1 capsule (100 mg total) by mouth 2 (two) times daily. 12/21/14   Junie Spencer, FNP  lisinopril (PRINIVIL,ZESTRIL) 20 MG tablet Take 1 tablet (20 mg total) by mouth daily. 12/21/14   Junie Spencer, FNP  Melatonin 1 MG TABS Take 1 mg by mouth daily.    Historical  Provider, MD  Multiple Vitamins-Minerals (WOMENS MULTIVITAMIN PLUS PO) Take 1 tablet by mouth daily.    Historical Provider, MD  orlistat (XENICAL) 120 MG capsule Take 1 capsule (120 mg total) by mouth 3 (three) times daily with meals. 03/26/15   Tiffany A Gann, PA-C  Probiotic Product (PROBIOTIC DAILY PO) Take 1 tablet by mouth daily.    Historical Provider, MD  psyllium (METAMUCIL) 58.6 % powder Take 1 packet by mouth daily.    Historical Provider, MD  SUMAtriptan (IMITREX) 100 MG tablet Take 1 tablet (100 mg total) by mouth every 2 (two) hours as needed for migraine. May repeat in 2 hours if headache persists or recurs. 01/23/15   Junie Spencer, FNP  Vitamin D, Ergocalciferol, (DRISDOL) 50000 UNITS CAPS capsule Take 1 capsule (50,000 Units total) by mouth every 7 (seven) days. 11/16/14   Christy A Hawks, FNP   BP 131/79 mmHg  Pulse 72  Temp(Src) 97.5 F (36.4 C) (Oral)  Resp 15  Ht  (1.626 m)  Wt 104.327 kg  BMI 39.46 kg/m2  SpO2 99%  LMP 08/24/2014 Physical Exam  Constitutional: She is oriented to person, place, and time. She appears well-developed and well-nourished.  Non-toxic appearance.  HENT:  Head: Normocephalic.  Right Ear: Tympanic membrane and external ear normal.  Left Ear: Tympanic membrane and external ear normal.  Eyes: EOM and lids are normal. Pupils are equal, round, and reactive to light.  Neck: Normal range of motion. Neck supple. Carotid bruit is not present.  Cardiovascular: Normal rate, regular rhythm, normal heart sounds, intact distal pulses and normal pulses.  Exam reveals no friction rub.   No murmur heard. Pulmonary/Chest: Effort normal and breath sounds normal. No respiratory distress. She has no wheezes. She exhibits tenderness.  Anterior chest wall tenderness to palpation, and with some range of motion.  Abdominal: Soft. Bowel sounds are normal. She exhibits no distension. There is no tenderness. There is no guarding.  Musculoskeletal: Normal range of  motion. She exhibits no edema or tenderness.  Neg Homan's sign Cap refill is less than 2 sec.  Lymphadenopathy:       Head (right side): No submandibular adenopathy present.       Head (left side): No submandibular adenopathy present.    She has no cervical adenopathy.  Neurological: She is alert and oriented to person, place, and time. She has normal strength. No cranial nerve deficit or sensory deficit.  Skin: Skin is warm and dry.  Psychiatric: She has a normal mood and affect. Her speech is normal.  Nursing note and vitals reviewed.   ED Course  Procedures (including critical care time) Labs Review Labs Reviewed  CBC WITH DIFFERENTIAL/PLATELET  BASIC METABOLIC PANEL  TROPONIN I  Imaging Review No results found. I have personally reviewed and evaluated these images and lab results as part of my medical decision-making.   EKG Interpretation None      MDM  Vital signs are well within normal limits. I have reviewed the previous office records and emergency room records.  CBC today is well within normal limits. Basic metabolic panel is well within normal limits. Troponin is negative for acute event. D-dimer is also negative for acute PE/DVT.   Pt c/o tightness in the chest. Second troponin and EKG to be obtained.  Second EKG and troponin neg for acute problem. Discussed findings with the patient in detail. Questions answered. Pt to return to the ED immediately if any changes or problem.    Final diagnoses:  Chest wall pain    **I have reviewed nursing notes, vital signs, and all appropriate lab and imaging results for this patient.Ivery Quale, PA-C 10/28/15 1218  Raeford Razor, MD 10/29/15 [phone]

## 2015-10-25 NOTE — ED Notes (Signed)
Pt reports has history of PE.  Reports chest pain and shortness of breath since last night.  Pt says is no longer on coumadin or lovenox.  Pt say she takes aspirin.

## 2015-12-20 ENCOUNTER — Encounter (INDEPENDENT_AMBULATORY_CARE_PROVIDER_SITE_OTHER): Payer: Self-pay

## 2015-12-20 ENCOUNTER — Encounter: Payer: Self-pay | Admitting: Family

## 2015-12-20 ENCOUNTER — Ambulatory Visit (INDEPENDENT_AMBULATORY_CARE_PROVIDER_SITE_OTHER): Payer: Medicare Other | Admitting: Family

## 2015-12-20 VITALS — BP 134/97 | HR 79 | Temp 97.5°F | Ht 62.0 in | Wt 229.0 lb

## 2015-12-20 DIAGNOSIS — J309 Allergic rhinitis, unspecified: Secondary | ICD-10-CM

## 2015-12-20 DIAGNOSIS — R0789 Other chest pain: Secondary | ICD-10-CM | POA: Diagnosis not present

## 2015-12-20 DIAGNOSIS — R600 Localized edema: Secondary | ICD-10-CM

## 2015-12-20 DIAGNOSIS — M545 Low back pain, unspecified: Secondary | ICD-10-CM

## 2015-12-20 DIAGNOSIS — I10 Essential (primary) hypertension: Secondary | ICD-10-CM

## 2015-12-20 DIAGNOSIS — R609 Edema, unspecified: Secondary | ICD-10-CM | POA: Diagnosis not present

## 2015-12-20 DIAGNOSIS — M51379 Other intervertebral disc degeneration, lumbosacral region without mention of lumbar back pain or lower extremity pain: Secondary | ICD-10-CM

## 2015-12-20 DIAGNOSIS — R0602 Shortness of breath: Secondary | ICD-10-CM | POA: Diagnosis not present

## 2015-12-20 DIAGNOSIS — G8929 Other chronic pain: Secondary | ICD-10-CM | POA: Diagnosis not present

## 2015-12-20 DIAGNOSIS — G6289 Other specified polyneuropathies: Secondary | ICD-10-CM

## 2015-12-20 DIAGNOSIS — M5137 Other intervertebral disc degeneration, lumbosacral region: Secondary | ICD-10-CM | POA: Diagnosis not present

## 2015-12-20 MED ORDER — FLUTICASONE PROPIONATE 50 MCG/ACT NA SUSP: 2.0000 | Freq: Every day | NASAL | Status: DC

## 2015-12-20 MED ORDER — CYCLOBENZAPRINE HCL 10 MG PO TABS: 10.0000 mg | ORAL_TABLET | Freq: Three times a day (TID) | ORAL | Status: DC | PRN

## 2015-12-20 MED ORDER — ALBUTEROL SULFATE HFA 108 (90 BASE) MCG/ACT IN AERS: 2.0000 | INHALATION_SPRAY | Freq: Four times a day (QID) | RESPIRATORY_TRACT | Status: AC | PRN

## 2015-12-20 MED ORDER — LISINOPRIL 20 MG PO TABS: 20.0000 mg | ORAL_TABLET | Freq: Every day | ORAL | Status: DC

## 2015-12-20 MED ORDER — SUMATRIPTAN SUCCINATE 100 MG PO TABS: 100.0000 mg | ORAL_TABLET | ORAL | Status: DC | PRN

## 2015-12-20 MED ORDER — GABAPENTIN 100 MG PO CAPS: 100.0000 mg | ORAL_CAPSULE | Freq: Two times a day (BID) | ORAL | Status: DC

## 2015-12-20 MED ORDER — ATORVASTATIN CALCIUM 40 MG PO TABS: 40.0000 mg | ORAL_TABLET | Freq: Every day | ORAL | Status: DC

## 2015-12-20 NOTE — Patient Instructions (Signed)

## 2015-12-20 NOTE — Progress Notes (Signed)
Subjective:    Patient ID: Nancy Watts, female    DOB: [birth year], 50 y.o.   MRN: [id number]  HPI Pt presents to the office today with left foot and leg swelling. Pt states her mother has passed away and she was packing things up and believes she packed her medications. Pt states it has been over a month since taking any medications. PT states she has the flu about a month ago, but continues to have a dry cough. Pt states she is living with her father at this time and he "smokes like a train" and this seems to be making the cough worse. Pt reports the cough is dry, but just seems tight and SOB.    Pt states since stopping her medications her nerve pain and back pain has worsens.   Review of Systems  Constitutional: Negative.   HENT: Negative.   Eyes: Negative.   Respiratory: Negative.  Negative for shortness of breath.   Cardiovascular: Negative.  Negative for palpitations.  Gastrointestinal: Negative.   Endocrine: Negative.   Genitourinary: Negative.   Musculoskeletal: Negative.   Neurological: Negative.  Negative for headaches.  Hematological: Negative.   Psychiatric/Behavioral: Negative.   All other systems reviewed and are negative.      Objective:   Physical Exam  Constitutional: She is oriented to person, place, and time. She appears well-developed and well-nourished. No distress.  HENT:  Head: Normocephalic.  Eyes: Pupils are equal, round, and reactive to light.  Neck: Normal range of motion. Neck supple. No thyromegaly present.  Cardiovascular: Normal rate, regular rhythm, normal heart sounds and intact distal pulses.   No murmur heard. Pulmonary/Chest: Effort normal and breath sounds normal. No respiratory distress. She has no wheezes.  Abdominal: Soft. Bowel sounds are normal. She exhibits no distension. There is no tenderness.  Musculoskeletal: Normal range of motion. She exhibits edema (2+ in left foot and leg). She exhibits no tenderness.  Neurological:  She is alert and oriented to person, place, and time.  Skin: Skin is warm and dry.  Psychiatric: She has a normal mood and affect. Her behavior is normal. Judgment and thought content normal.  Vitals reviewed.     BP 134/97 mmHg  Pulse 79  Temp(Src) 97.5 F (36.4 C) (Oral)  Ht  (1.575 m)  Wt 229 lb (103.874 kg)  BMI 41.87 kg/m2  LMP 08/24/2014     Assessment & Plan:  1. Degeneration of lumbar or lumbosacral intervertebral disc - cyclobenzaprine (FLEXERIL) 10 MG tablet; Take 1 tablet (10 mg total) by mouth 3 (three) times daily as needed for muscle spasms.  Dispense: 60 tablet; Refill: 2  2. Chronic lower back pain - cyclobenzaprine (FLEXERIL) 10 MG tablet; Take 1 tablet (10 mg total) by mouth 3 (three) times daily as needed for muscle spasms.  Dispense: 60 tablet; Refill: 2  3. Other polyneuropathy (HCC) - gabapentin (NEURONTIN) 100 MG capsule; Take 1 capsule (100 mg total) by mouth 2 (two) times daily.  Dispense: 180 capsule; Refill: 1  4. Essential hypertension - lisinopril (PRINIVIL,ZESTRIL) 20 MG tablet; Take 1 tablet (20 mg total) by mouth daily.  Dispense: 90 tablet; Refill: 1  5. Allergic rhinitis, unspecified allergic rhinitis type -PT to start zyrtec or Claritin  - fluticasone (FLONASE) 50 MCG/ACT nasal spray; Place 2 sprays into both nostrils daily.  Dispense: 16 g; Refill: 6  6. SOB (shortness of breath) - albuterol (PROVENTIL HFA;VENTOLIN HFA) 108 (90 Base) MCG/ACT inhaler; Inhale 2 puffs into the lungs every  6 (six) hours as needed for wheezing or shortness of breath.  Dispense: 1 Inhaler; Refill: 2  7. Chest tightness - albuterol (PROVENTIL HFA;VENTOLIN HFA) 108 (90 Base) MCG/ACT inhaler; Inhale 2 puffs into the lungs every 6 (six) hours as needed for wheezing or shortness of breath.  Dispense: 1 Inhaler; Refill: 2  8. Peripheral edema -PT start using compression hose daily  Continue all meds Diet and exercise encouraged RTO 3 months  Jannifer Rodneyhristy Hawks,  FNP

## 2016-01-13 IMAGING — MR MR LUMBAR SPINE W/O CM
4 of 5 series · 15 of 48 positions shown · non-contrast
Comparison: None.

CLINICAL DATA: 49-year-old female with chronic lumbar back pain
radiating to the left lower extremity. No known injury. Initial
encounter.

EXAM:
MRI LUMBAR SPINE WITHOUT CONTRAST
TECHNIQUE: Multiplanar, multisequence MR imaging of the lumbar spine was
performed. No intravenous contrast was administered.

[Series 3: T2 · sagittal · 4.0mm · 0.79mm/px · 6 of 13 slices shown (1 of 2)]
[im 1/13]
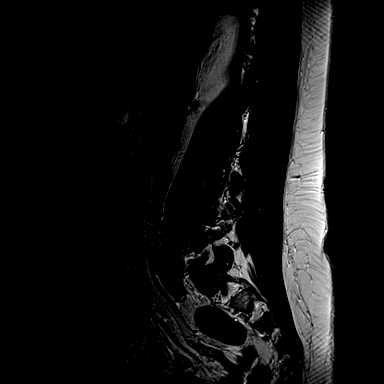
[im 3/13]
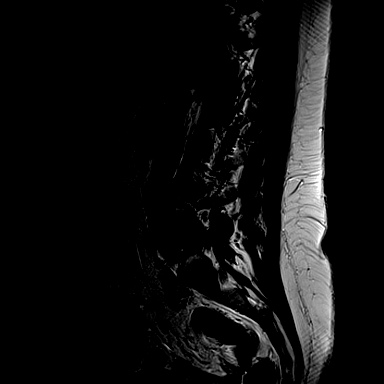
[im 5/13]
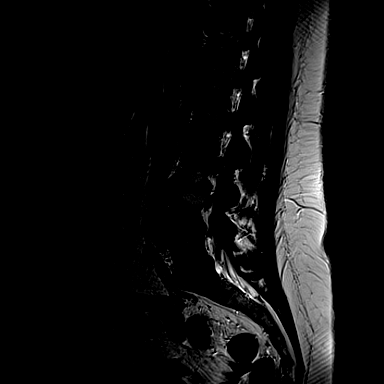
[im 8/13]
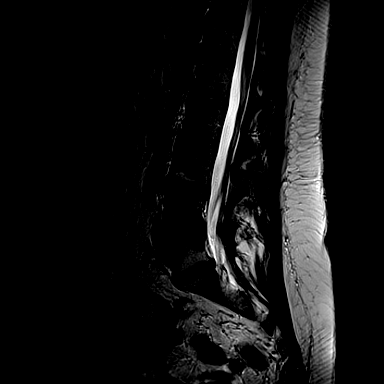
[im 10/13]
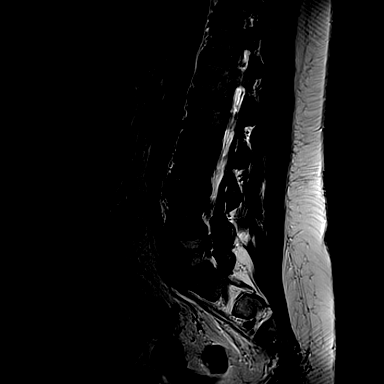
[im 13/13]
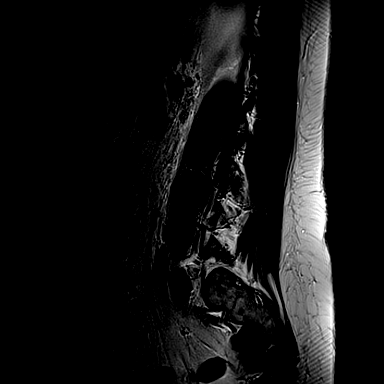

[Series 4: T1 · sagittal · 4.0mm · 0.39mm/px · 3 of 13 slices shown (1 of 2)]
[im 1/13]
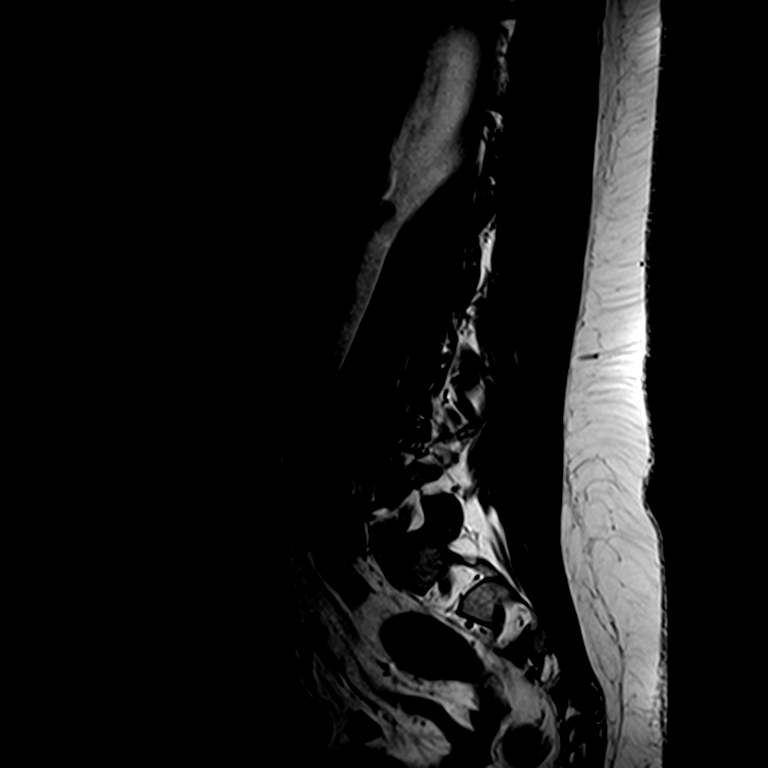
[im 7/13]
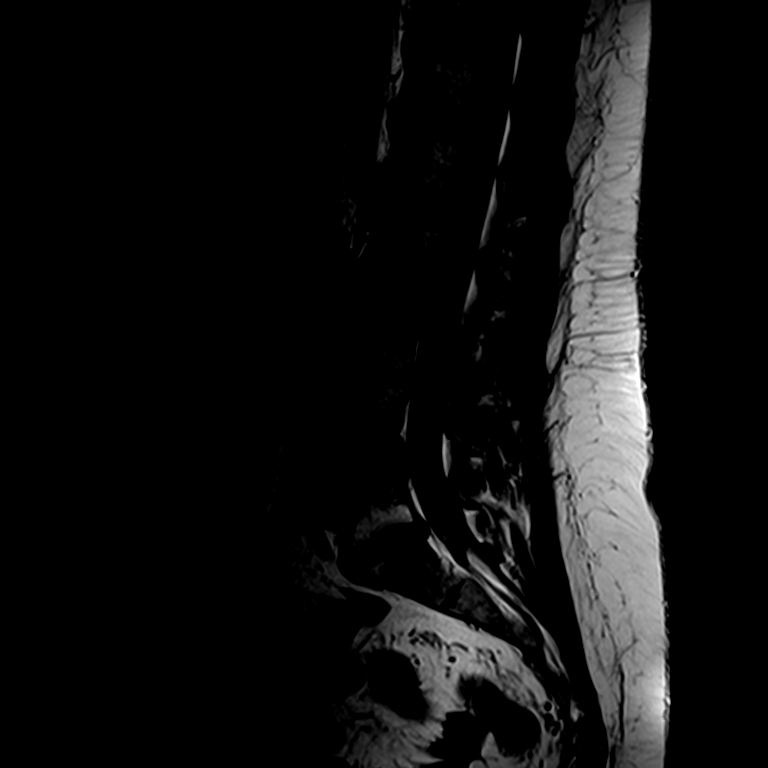
[im 13/13]
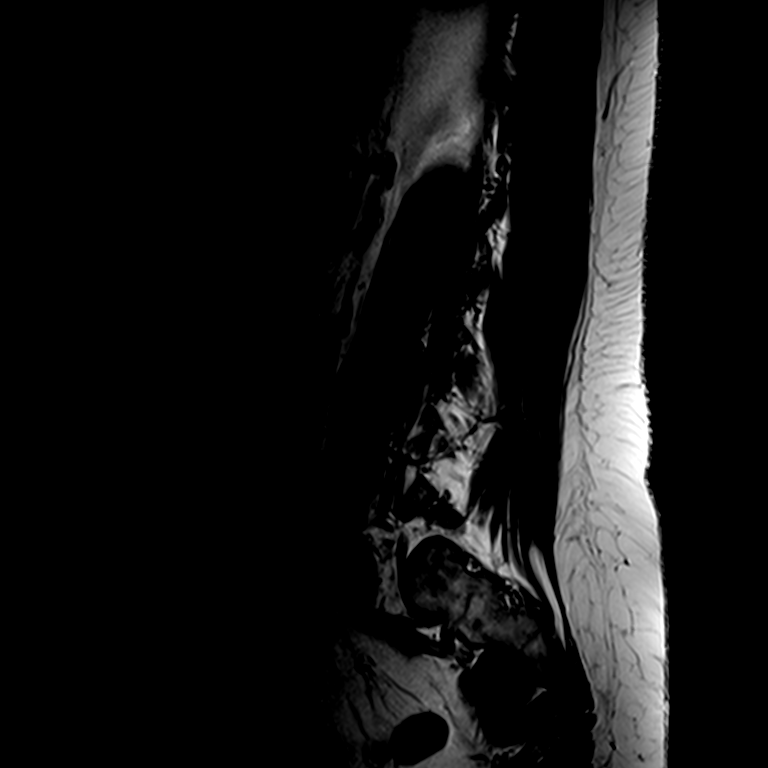

[Series 6: T2 · axial · 4.0mm · 0.25mm/px · z∈[-73,+81]mm · 3 of 41 slices shown (2 of 2)]
[im 6/41]
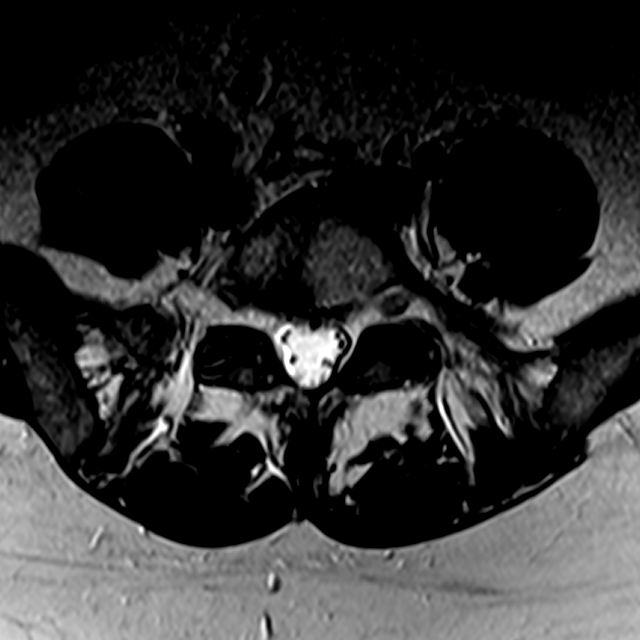
[im 22/41]
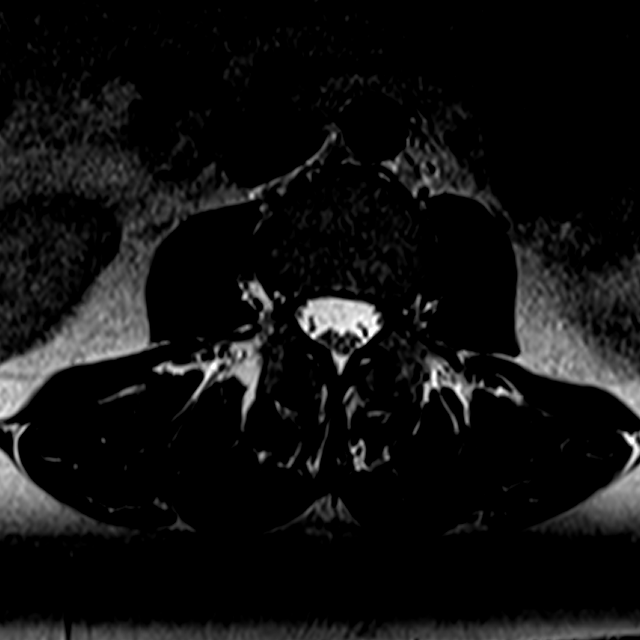
[im 35/41]
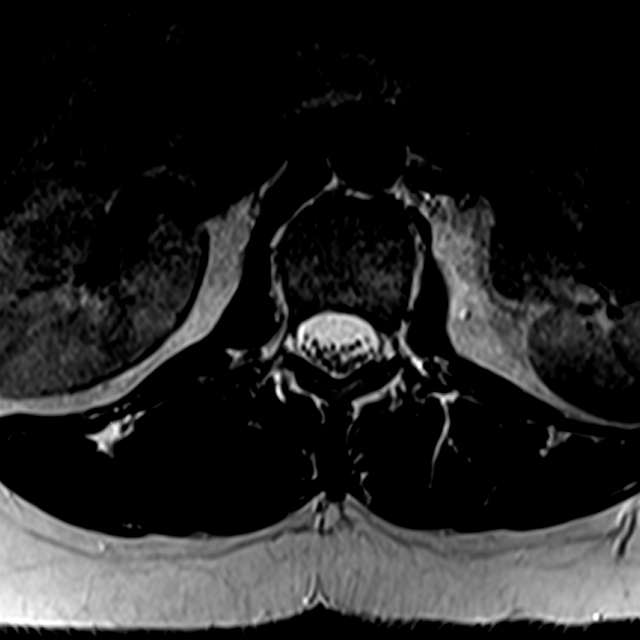

[Series 7: T1 · axial · 4.0mm · 0.27mm/px · z∈[-74,+82]mm · 3 of 41 slices shown (2 of 2)]
[im 6/41]
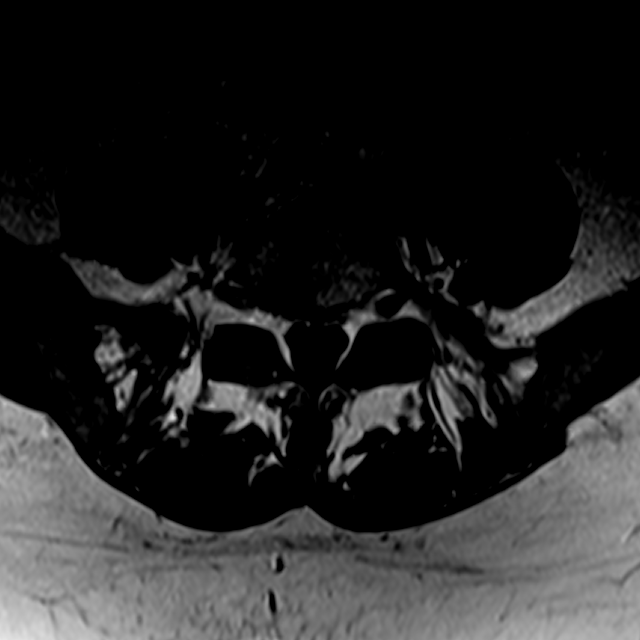
[im 22/41]
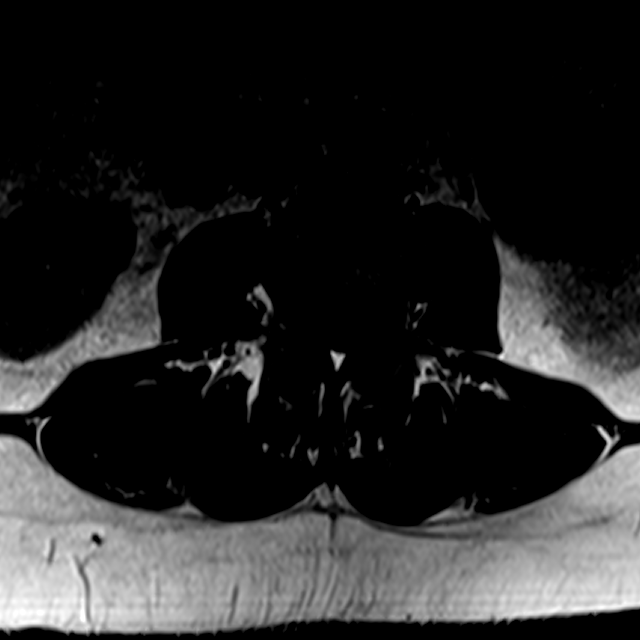
[im 35/41]
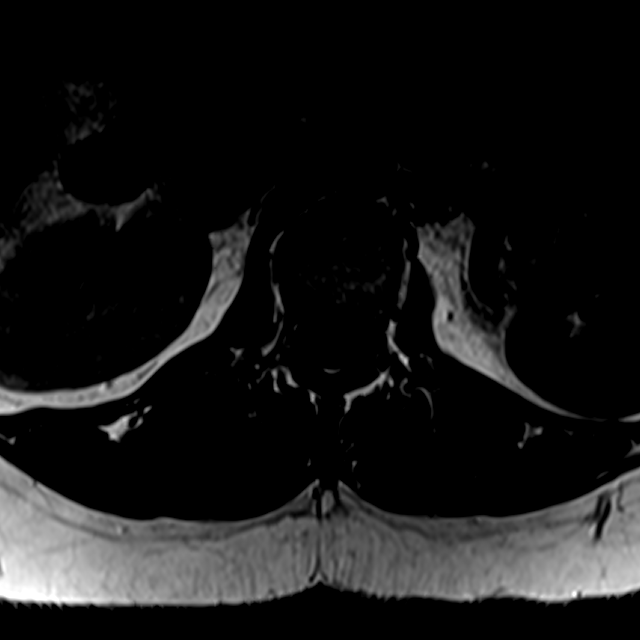

[15 of 48 positions shown; findings below may reference images not displayed]

FINDINGS: Lumbar segmentation appears to be normal and will be designated as
such for this report. Mild levoconvex lumbar scoliosis. Multilevel
chronic degenerative endplate changes. No marrow edema or evidence
of acute lumbar osseous abnormality.

There is a partially visible 12 mm T1 hypo intense and T2
heterogeneous lesion along the left SI joint best seen on series 6,
image 41. No surrounding marrow edema and this is circumscribed.

Visualized lower thoracic spinal cord is normal with conus medularis
at L1.

Visualized abdominal viscera and paraspinal soft tissues are within
normal limits.

T11-T12:  Negative.

T12-L1:  Negative.

L1-L2:  Negative disc.  Mild facet hypertrophy.  No stenosis.

L2-L3:  Negative disc.  Mild facet hypertrophy.  No stenosis.

L3-L4: Disc desiccation and disc space loss. Right eccentric
circumferential disc bulge and endplate osteophytosis. Broad-based
right foraminal component. Mild facet hypertrophy. Trace left facet
joint fluid. No spinal stenosis. Borderline to mild right lateral
recess stenosis and right L3 foraminal stenosis.

L4-L5: Disc desiccation. Mild left eccentric disc bulge. Mild to
moderate facet and ligament flavum hypertrophy. No spinal or lateral
recess stenosis. No significant foraminal stenosis.

L5-S1: Disc desiccation. Circumferential disc osteophyte complex
mild to moderate facet hypertrophy on the left. No spinal or lateral
recess stenosis. No significant foraminal stenosis.
IMPRESSION: 1. Chronic disc and endplate degeneration L3-L4 through L5-S1. No
spinal stenosis results. Up to mild right lateral recess and right
foraminal stenosis at L3-L4. Mild to moderate lumbar facet
degeneration at the same levels.
2. Partially visible but benign appearing 12 mm medial left iliac
bone lesion along the left SI joint. Considering absence of
convincing left side lumbar neural impingement, if left sacral or SI
joint pain is suspected then recommend follow-up sacral MRI (without
contrast should suffice).

## 2016-01-27 ENCOUNTER — Ambulatory Visit (INDEPENDENT_AMBULATORY_CARE_PROVIDER_SITE_OTHER): Payer: Medicare Other | Admitting: Family

## 2016-01-27 ENCOUNTER — Encounter: Payer: Self-pay | Admitting: Family

## 2016-01-27 VITALS — BP 147/105 | HR 75 | Temp 97.8°F | Ht 62.0 in | Wt 225.0 lb

## 2016-01-27 DIAGNOSIS — M5442 Lumbago with sciatica, left side: Secondary | ICD-10-CM | POA: Diagnosis not present

## 2016-01-27 DIAGNOSIS — G8929 Other chronic pain: Secondary | ICD-10-CM

## 2016-01-27 DIAGNOSIS — M545 Low back pain, unspecified: Secondary | ICD-10-CM

## 2016-01-27 DIAGNOSIS — M5137 Other intervertebral disc degeneration, lumbosacral region: Secondary | ICD-10-CM

## 2016-01-27 DIAGNOSIS — M5441 Lumbago with sciatica, right side: Secondary | ICD-10-CM | POA: Diagnosis not present

## 2016-01-27 MED ORDER — NAPROXEN 500 MG PO TABS: 500.0000 mg | ORAL_TABLET | Freq: Two times a day (BID) | ORAL | Status: DC

## 2016-01-27 MED ORDER — METHYLPREDNISOLONE ACETATE 80 MG/ML IJ SUSP
80.0000 mg | Freq: Once | INTRAMUSCULAR | Status: AC
  Administered 2016-01-27: 80 mg via INTRAMUSCULAR

## 2016-01-27 MED ORDER — KETOROLAC TROMETHAMINE 60 MG/2ML IM SOLN
60.0000 mg | Freq: Once | INTRAMUSCULAR | Status: AC
Start: 2016-01-27 — End: 2016-01-27
  Administered 2016-01-27: 60 mg via INTRAMUSCULAR

## 2016-01-27 MED ORDER — CYCLOBENZAPRINE HCL 10 MG PO TABS: 10.0000 mg | ORAL_TABLET | Freq: Three times a day (TID) | ORAL | Status: DC | PRN

## 2016-01-27 NOTE — Progress Notes (Addendum)
   Subjective:    Patient ID: Nancy Watts, female    DOB: [birth year], 50 y.o.   MRN: [id number]  Back Pain This is a recurrent problem. The current episode started 1 to 4 weeks ago. The problem occurs constantly. The problem is unchanged. The pain is present in the lumbar spine. The quality of the pain is described as aching. The pain is at a severity of 9/10. The pain is severe. Associated symptoms include leg pain, numbness and tingling. Pertinent negatives include no bladder incontinence or bowel incontinence. She has tried bed rest, analgesics, ice and NSAIDs for the symptoms. The treatment provided mild relief.      Review of Systems  Gastrointestinal: Negative for bowel incontinence.  Genitourinary: Negative for bladder incontinence.  Musculoskeletal: Positive for back pain.  Neurological: Positive for tingling and numbness.  All other systems reviewed and are negative.      Objective:   Physical Exam  Constitutional: She is oriented to person, place, and time. She appears well-developed and well-nourished. No distress.  HENT:  Head: Normocephalic and atraumatic.  Eyes: Pupils are equal, round, and reactive to light.  Neck: Normal range of motion. Neck supple. No thyromegaly present.  Cardiovascular: Normal rate, regular rhythm, normal heart sounds and intact distal pulses.   No murmur heard. Pulmonary/Chest: Effort normal and breath sounds normal. No respiratory distress. She has no wheezes.  Abdominal: Soft. Bowel sounds are normal. She exhibits no distension. There is no tenderness.  Musculoskeletal: She exhibits tenderness. She exhibits no edema.  Decreased ROM with bending and rotation related to pain Positive straight leg   Neurological: She is alert and oriented to person, place, and time. She has normal reflexes. No cranial nerve deficit.  Skin: Skin is warm and dry.  Psychiatric: She has a normal mood and affect. Her behavior is normal. Judgment and thought  content normal.  Vitals reviewed.   BP 147/105 mmHg  Pulse 75  Temp(Src) 97.8 F (36.6 C) (Oral)  Ht 5\' 2"  (1.575 m)  Wt 225 lb (102.059 kg)  BMI 41.14 kg/m2  LMP 08/24/2014       Assessment & Plan:  1. Degeneration of lumbar or lumbosacral intervertebral disc - methylPREDNISolone acetate (DEPO-MEDROL) injection 80 mg; Inject 1 mL (80 mg total) into the muscle once. - ketorolac (TORADOL) injection 60 mg; Inject 2 mLs (60 mg total) into the muscle once. - naproxen (NAPROSYN) 500 MG tablet; Take 1 tablet (500 mg total) by mouth 2 (two) times daily with a meal.  Dispense: 60 tablet; Refill: 1  2. Bilateral low back pain with sciatica, sciatica laterality unspecified -Rest -Ice and heat as needed -Continue flexeril  -RTO In 1 week  - methylPREDNISolone acetate (DEPO-MEDROL) injection 80 mg; Inject 1 mL (80 mg total) into the muscle once. - ketorolac (TORADOL) injection 60 mg; Inject 2 mLs (60 mg total) into the muscle once. - naproxen (NAPROSYN) 500 MG tablet; Take 1 tablet (500 mg total) by mouth 2 (two) times daily with a meal.  Dispense: 60 tablet; Refill: 1  Orders Placed This Encounter  Procedures  . Ambulatory referral to Neurosurgery    Referral Priority:  Routine    Referral Type:  Surgical    Referral Reason:  Specialty Services Required    Requested Specialty:  Neurosurgery    Number of Visits Requested:  1    Jannifer Rodneyhristy [place], [place]

## 2016-01-27 NOTE — Patient Instructions (Signed)

## 2016-01-27 NOTE — Addendum Note (Signed)
Addended by: Jannifer RodneyHAWKS, CHRISTY A on: 01/27/2016 01:00 PM   Modules accepted: Orders

## 2016-05-13 ENCOUNTER — Telehealth: Payer: Self-pay | Admitting: Family Medicine

## 2016-05-22 DIAGNOSIS — G600 Hereditary motor and sensory neuropathy: Secondary | ICD-10-CM | POA: Diagnosis not present

## 2016-05-22 DIAGNOSIS — M47816 Spondylosis without myelopathy or radiculopathy, lumbar region: Secondary | ICD-10-CM | POA: Diagnosis not present

## 2016-06-12 ENCOUNTER — Ambulatory Visit (INDEPENDENT_AMBULATORY_CARE_PROVIDER_SITE_OTHER): Payer: Medicare Other | Admitting: Nurse Practitioner

## 2016-06-12 ENCOUNTER — Encounter: Payer: Self-pay | Admitting: Nurse Practitioner

## 2016-06-12 VITALS — BP 142/96 | HR 68 | Temp 97.0°F | Ht 62.0 in | Wt 224.0 lb

## 2016-06-12 DIAGNOSIS — L200 Besnier's prurigo: Secondary | ICD-10-CM | POA: Diagnosis not present

## 2016-06-12 DIAGNOSIS — L239 Allergic contact dermatitis, unspecified cause: Secondary | ICD-10-CM

## 2016-06-12 MED ORDER — METHYLPREDNISOLONE ACETATE 80 MG/ML IJ SUSP
80.0000 mg | Freq: Once | INTRAMUSCULAR | Status: AC
  Administered 2016-06-12: 80 mg via INTRAMUSCULAR

## 2016-06-12 NOTE — Patient Instructions (Signed)
Contact Dermatitis Dermatitis is redness, soreness, and swelling (inflammation) of the skin. Contact dermatitis is a reaction to certain substances that touch the skin. There are two types of contact dermatitis:   Irritant contact dermatitis. This type is caused by something that irritates your skin, such as dry hands from washing them too much. This type does not require previous exposure to the substance for a reaction to occur. This type is more common.  Allergic contact dermatitis. This type is caused by a substance that you are allergic to, such as a nickel allergy or poison ivy. This type only occurs if you have been exposed to the substance (allergen) before. Upon a repeat exposure, your body reacts to the substance. This type is less common. CAUSES  Many different substances can cause contact dermatitis. Irritant contact dermatitis is most commonly caused by exposure to:   Makeup.   Soaps.   Detergents.   Bleaches.   Acids.   Metal salts, such as nickel.  Allergic contact dermatitis is most commonly caused by exposure to:   Poisonous plants.   Chemicals.   Jewelry.   Latex.   Medicines.   Preservatives in products, such as clothing.  RISK FACTORS This condition is more likely to develop in:   People who have jobs that expose them to irritants or allergens.  People who have certain medical conditions, such as asthma or eczema.  SYMPTOMS  Symptoms of this condition may occur anywhere on your body where the irritant has touched you or is touched by you. Symptoms include:  Dryness or flaking.   Redness.   Cracks.   Itching.   Pain or a burning feeling.   Blisters.  Drainage of small amounts of blood or clear fluid from skin cracks. With allergic contact dermatitis, there may also be swelling in areas such as the eyelids, mouth, or genitals.  DIAGNOSIS  This condition is diagnosed with a medical history and physical exam. A patch skin test  may be performed to help determine the cause. If the condition is related to your job, you may need to see an occupational medicine specialist. TREATMENT Treatment for this condition includes figuring out what caused the reaction and protecting your skin from further contact. Treatment may also include:   Steroid creams or ointments. Oral steroid medicines may be needed in more severe cases.  Antibiotics or antibacterial ointments, if a skin infection is present.  Antihistamine lotion or an antihistamine taken by mouth to ease itching.  A bandage (dressing). HOME CARE INSTRUCTIONS Skin Care  Moisturize your skin as needed.   Apply cool compresses to the affected areas.  Try taking a bath with:  Epsom salts. Follow the instructions on the packaging. You can get these at your local pharmacy or grocery store.  Baking soda. Pour a small amount into the bath as directed by your health care provider.  Colloidal oatmeal. Follow the instructions on the packaging. You can get this at your local pharmacy or grocery store.  Try applying baking soda paste to your skin. Stir water into baking soda until it reaches a paste-like consistency.  Do not scratch your skin.  Bathe less frequently, such as every other day.  Bathe in lukewarm water. Avoid using hot water. Medicines  Take or apply over-the-counter and prescription medicines only as told by your health care provider.   If you were prescribed an antibiotic medicine, take or apply your antibiotic as told by your health care provider. Do not stop using the   antibiotic even if your condition starts to improve. General Instructions  Keep all follow-up visits as told by your health care provider. This is important.  Avoid the substance that caused your reaction. If you do not know what caused it, keep a journal to try to track what caused it. Write down:  What you eat.  What cosmetic products you use.  What you drink.  What  you wear in the affected area. This includes jewelry.  If you were given a dressing, take care of it as told by your health care provider. This includes when to change and remove it. SEEK MEDICAL CARE IF:   Your condition does not improve with treatment.  Your condition gets worse.  You have signs of infection such as swelling, tenderness, redness, soreness, or warmth in the affected area.  You have a fever.  You have new symptoms. SEEK IMMEDIATE MEDICAL CARE IF:   You have a severe headache, neck pain, or neck stiffness.  You vomit.  You feel very sleepy.  You notice red streaks coming from the affected area.  Your bone or joint underneath the affected area becomes painful after the skin has healed.  The affected area turns darker.  You have difficulty breathing.   This information is not intended to replace advice given to you by your health care provider. Make sure you discuss any questions you have with your health care provider.   Document Released: 08/28/2000 Document Revised: 05/22/2015 Document Reviewed: 01/16/2015 [employer] Interactive Patient Education 2016 [employer]  

## 2016-06-12 NOTE — Progress Notes (Signed)
   Subjective:    Patient ID: Nancy Watts, female    DOB: 13-Jul-1966, 50 y.o.   MRN: [id number]  HPI Patient in today c/o rash- started a few days ago- her sister came to visit her and she thinks that she brought bedbugs to her house. She put a shirt on that her sister had borrowed and immediatly started itching and has gotten up into hair line back of neck.  * She also saw neurosurgeon for back problems- her toes on on bil feel are flexing ( curled inward). He told her he had charot marie tooth  Review of Systems  Constitutional: Negative.   HENT: Negative.   Respiratory: Negative.   Cardiovascular: Negative.   Genitourinary: Negative.   Musculoskeletal: Negative.   Skin: Positive for rash.  Neurological: Negative.   Psychiatric/Behavioral: Negative.   All other systems reviewed and are negative.      Objective:   Physical Exam  Constitutional: She is oriented to person, place, and time. She appears well-developed and well-nourished.  Cardiovascular: Normal rate, regular rhythm and normal heart sounds.   Pulmonary/Chest: Effort normal and breath sounds normal.  Musculoskeletal:  alll toes are drawing to flexion.  Neurological: She is alert and oriented to person, place, and time.  Skin: Skin is warm. Rash (erythematous macular rash on back and forearms.) noted.  Psychiatric: She has a normal mood and affect. Her behavior is normal. Judgment and thought content normal.   BP (!) 148/108   Pulse 68   Temp 97 F (36.1 C) (Oral)   Ht 5\' 2"  (1.575 m)   Wt 224 lb (101.6 kg)   LMP 08/24/2014   BMI 40.97 kg/m         Assessment & Plan:   1. Allergic dermatitis    Meds ordered this encounter  Medications  . methylPREDNISolone acetate (DEPO-MEDROL) injection 80 mg   Avoid scratching Cool compresses rto prn  Mary-Margaret Daphine DeutscherMartin, FNP

## 2016-06-22 ENCOUNTER — Ambulatory Visit (INDEPENDENT_AMBULATORY_CARE_PROVIDER_SITE_OTHER): Payer: Medicare Other | Admitting: Pediatrics

## 2016-06-22 ENCOUNTER — Encounter: Payer: Self-pay | Admitting: Pediatrics

## 2016-06-22 ENCOUNTER — Ambulatory Visit (INDEPENDENT_AMBULATORY_CARE_PROVIDER_SITE_OTHER): Payer: Medicare Other

## 2016-06-22 VITALS — BP 143/97 | HR 62 | Temp 97.3°F | Ht 62.0 in | Wt 224.4 lb

## 2016-06-22 DIAGNOSIS — M25552 Pain in left hip: Secondary | ICD-10-CM

## 2016-06-22 NOTE — Progress Notes (Signed)
  Subjective:   Patient ID: Nancy MediaFlorence D Renstrom, female    DOB: 10-17-1965, 50 y.o.   MRN: [id number] CC: Hip Pain (left)  HPI: Nancy Watts is a 50 y.o. female presenting for Hip Pain (left)  Hip has been bothering her for a while Then last night was twisting, trying to get mattress on floor rolled up,  Twisted wrong, L side Heard something pop, has been hurting a lot since then Not able to sleep last night due to pain Able to weight bear but does have some pain Feels better sitting Hurts more when she lays down Pain with moving knee  Relevant past medical, surgical, family and social history reviewed. Allergies and medications reviewed and updated. History  Smoking Status  . Never Smoker  Smokeless Tobacco  . Never Used   ROS: Per HPI   Objective:    BP (!) 143/97   Pulse 62   Temp 97.3 F (36.3 C) (Oral)   Ht 5\' 2"  (1.575 m)   Wt 224 lb 6.4 oz (101.8 kg)   LMP 08/24/2014   BMI 41.04 kg/m   Wt Readings from Last 3 Encounters:  06/22/16 224 lb 6.4 oz (101.8 kg)  06/12/16 224 lb (101.6 kg)  01/27/16 225 lb (102.1 kg)     Physical Exam Gen: NAD, alert, cooperative with exam, NCAT EYES: EOMI, no conjunctival injection, or no icterus CV: NRRR, normal S1/S2, no murmur, distal pulses 2+ b/l Resp: CTABL, no wheezes, normal WOB Abd: +BS, soft, NTND. no guarding or organomegaly Ext: No edema, warm Neuro: Alert and oriented, coordination grossly normal MSK: normal ROM R hip. Refuses int/ext hip exam of L hip due to pain. Legs same length, not able to roll straight L in and out due to pt concern for pain. Some pain with palpation lateral L hip  Assessment & Plan:  Cristy FriedlanderFlorence was seen today for hip pain. Xray without fracture. NSAIDs, rest. Referral to sports medicine placed.  Diagnoses and all orders for this visit:  Pain of left hip joint -     DG HIP UNILAT W OR W/O PELVIS 2-3 VIEWS LEFT; Future -     Ambulatory referral to Sports Medicine   Follow up plan: For  CPE as scheduled Rex Krasarol Lourdes Kucharski, MD [facility]

## 2016-06-22 NOTE — Patient Instructions (Signed)
Naproxen twice a day Ice Rest

## 2016-06-29 ENCOUNTER — Encounter: Payer: Medicare Other | Admitting: Pediatrics

## 2016-06-30 ENCOUNTER — Encounter: Payer: Self-pay | Admitting: Family Medicine

## 2016-07-10 ENCOUNTER — Ambulatory Visit: Payer: Medicare Other | Admitting: Family Medicine

## 2016-07-22 ENCOUNTER — Encounter: Payer: Medicare Other | Admitting: Pediatrics

## 2016-07-23 ENCOUNTER — Encounter: Payer: Self-pay | Admitting: Family Medicine

## 2016-07-23 ENCOUNTER — Telehealth: Payer: Self-pay | Admitting: Family Medicine

## 2016-08-07 NOTE — Telephone Encounter (Signed)
Patient has follow up appt scheduled

## 2016-08-11 ENCOUNTER — Encounter: Payer: Self-pay | Admitting: Family

## 2016-08-11 ENCOUNTER — Ambulatory Visit (INDEPENDENT_AMBULATORY_CARE_PROVIDER_SITE_OTHER): Payer: Medicare Other | Admitting: Family

## 2016-08-11 VITALS — BP 101/71 | HR 74 | Temp 97.0°F | Ht 62.0 in | Wt 215.0 lb

## 2016-08-11 DIAGNOSIS — E559 Vitamin D deficiency, unspecified: Secondary | ICD-10-CM

## 2016-08-11 DIAGNOSIS — Z0000 Encounter for general adult medical examination without abnormal findings: Secondary | ICD-10-CM

## 2016-08-11 DIAGNOSIS — G43109 Migraine with aura, not intractable, without status migrainosus: Secondary | ICD-10-CM

## 2016-08-11 DIAGNOSIS — G6289 Other specified polyneuropathies: Secondary | ICD-10-CM

## 2016-08-11 DIAGNOSIS — M5137 Other intervertebral disc degeneration, lumbosacral region: Secondary | ICD-10-CM

## 2016-08-11 DIAGNOSIS — I10 Essential (primary) hypertension: Secondary | ICD-10-CM

## 2016-08-11 DIAGNOSIS — G4700 Insomnia, unspecified: Secondary | ICD-10-CM

## 2016-08-11 DIAGNOSIS — D649 Anemia, unspecified: Secondary | ICD-10-CM

## 2016-08-11 DIAGNOSIS — Z01419 Encounter for gynecological examination (general) (routine) without abnormal findings: Secondary | ICD-10-CM

## 2016-08-11 DIAGNOSIS — E785 Hyperlipidemia, unspecified: Secondary | ICD-10-CM

## 2016-08-11 DIAGNOSIS — Z1239 Encounter for other screening for malignant neoplasm of breast: Secondary | ICD-10-CM

## 2016-08-11 DIAGNOSIS — Z86711 Personal history of pulmonary embolism: Secondary | ICD-10-CM

## 2016-08-11 MED ORDER — ATORVASTATIN CALCIUM 40 MG PO TABS: 40.0000 mg | ORAL_TABLET | Freq: Every day | ORAL | 1 refills | Status: AC

## 2016-08-11 MED ORDER — GABAPENTIN 100 MG PO CAPS: 100.0000 mg | ORAL_CAPSULE | Freq: Two times a day (BID) | ORAL | 1 refills | Status: AC

## 2016-08-11 MED ORDER — SUMATRIPTAN SUCCINATE 100 MG PO TABS: 100.0000 mg | ORAL_TABLET | ORAL | 1 refills | Status: AC | PRN

## 2016-08-11 NOTE — Progress Notes (Signed)
Subjective:    Patient ID: Nancy Watts, female    DOB: 1966-03-15, 50 y.o.   MRN: [id number]  Pt presents to the office today today for CPE with pap. PT states she has been off all of her medications over the last week.  Hypertension  This is a chronic problem. The current episode started more than 1 year ago. The problem has been resolved since onset. The problem is controlled. Associated symptoms include blurred vision. Pertinent negatives include no anxiety, headaches, malaise/fatigue, palpitations, peripheral edema or shortness of breath. Risk factors for coronary artery disease include dyslipidemia, obesity and family history. Past treatments include diuretics. The current treatment provides moderate improvement. There is no history of kidney disease, CAD/MI, CVA, heart failure, PVD or a thyroid problem. There is no history of sleep apnea.  Hyperlipidemia  This is a chronic problem. The current episode started more than 1 year ago. The problem is uncontrolled. Recent lipid tests were reviewed and are high. Exacerbating diseases include obesity. Pertinent negatives include no shortness of breath. Current antihyperlipidemic treatment includes diet change. The current treatment provides no improvement of lipids. Risk factors for coronary artery disease include dyslipidemia, family history, obesity and a sedentary lifestyle.  Headache   This is a chronic problem. The current episode started more than 1 year ago. Episode frequency: twice a week. The problem has been waxing and waning. The pain is located in the temporal region. The pain does not radiate. The pain quality is similar to prior headaches. The quality of the pain is described as aching. The pain is at a severity of 10/10. The pain is moderate. Associated symptoms include blurred vision, nausea and photophobia. She has tried darkened room, Excedrin and NSAIDs for the symptoms. The treatment provided mild relief. Her past medical history  is significant for hypertension and obesity.  Constipation  This is a chronic problem. The current episode started more than 1 year ago. The problem has been resolved since onset. Her stool frequency is 1 time per day. The patient is on a high fiber diet. Associated symptoms include nausea.  Peripheral Neuropathy Pt states she is having bilateral leg pains and back pain that wakes her up from sleep. Pt states she has not taken gabapentin in several weeks, because she has ran out of medication.     Review of Systems  Constitutional: Negative.  Negative for malaise/fatigue.  HENT: Negative.   Eyes: Positive for blurred vision and photophobia.  Respiratory: Negative.  Negative for shortness of breath.   Cardiovascular: Negative.  Negative for palpitations.  Gastrointestinal: Positive for constipation and nausea.  Endocrine: Negative.   Genitourinary: Negative.   Musculoskeletal: Negative.   Neurological: Negative.  Negative for headaches.  Hematological: Negative.   Psychiatric/Behavioral: Negative.   All other systems reviewed and are negative.      Objective:   Physical Exam  Constitutional: She is oriented to person, place, and time. She appears well-developed and well-nourished. No distress.  HENT:  Head: Normocephalic and atraumatic.  Eyes: Pupils are equal, round, and reactive to light.  Neck: Normal range of motion. Neck supple. No thyromegaly present.  Cardiovascular: Normal rate, regular rhythm, normal heart sounds and intact distal pulses.   No murmur heard. Pulmonary/Chest: Effort normal and breath sounds normal. No respiratory distress. She has no wheezes. Right breast exhibits no inverted nipple, no mass, no nipple discharge, no skin change and no tenderness. Left breast exhibits no inverted nipple, no mass, no nipple discharge, no skin change  and no tenderness. Breasts are symmetrical.  Abdominal: Soft. Bowel sounds are normal. She exhibits no distension. There is no  tenderness.  Genitourinary: Vagina normal.  Genitourinary Comments: Bimanual exam- no adnexal masses or tenderness, ovaries nonpalpable   Cervix parous and pink- No discharge   Musculoskeletal: Normal range of motion. She exhibits no edema or tenderness.  Left toes curled under  Neurological: She is alert and oriented to person, place, and time. She has normal reflexes. No cranial nerve deficit.  Skin: Skin is warm and dry.  Psychiatric: She has a normal mood and affect. Her behavior is normal. Judgment and thought content normal.  Vitals reviewed.    BP 101/71   Pulse 74   Temp 97 F (36.1 C) (Oral)   Ht '5\' 2"'  (1.575 m)   Wt 215 lb (97.5 kg)   LMP 08/24/2014   BMI 39.32 kg/m      Assessment & Plan:  1. Essential hypertension -Will stop lisinopril today. BP is WNL and pt has not had meds in last several weeks - CMP14+EGFR - CBC with Differential/Platelet  2. Other polyneuropathy ([place]) - CMP14+EGFR - CBC with Differential/Platelet - gabapentin (NEURONTIN) 100 MG capsule; Take 1 capsule (100 mg total) by mouth 2 (two) times daily.  Dispense: 180 capsule; Refill: 1  3. Anemia, unspecified type - CMP14+EGFR - CBC with Differential/Platelet  4. Hyperlipidemia, unspecified hyperlipidemia type - CMP14+EGFR - CBC with Differential/Platelet - Lipid panel - atorvastatin (LIPITOR) 40 MG tablet; Take 1 tablet (40 mg total) by mouth daily.  Dispense: 90 tablet; Refill: 1  5. Vitamin D deficiency - CMP14+EGFR - CBC with Differential/Platelet - VITAMIN D 25 Hydroxy (Vit-D Deficiency, Fractures)  6. Morbid obesity ([place]) - CMP14+EGFR - CBC with Differential/Platelet  7. Insomnia, unspecified type - CMP14+EGFR - CBC with Differential/Platelet  8. Hx of pulmonary embolus - CMP14+EGFR - CBC with Differential/Platelet - Ambulatory referral to Pulmonology  9. Degeneration of lumbar or lumbosacral intervertebral disc - CMP14+EGFR - CBC with Differential/Platelet -  Ambulatory referral to Neurology  10. Breast cancer screening - MM DIGITAL SCREENING BILATERAL; Future - CMP14+EGFR - CBC with Differential/Platelet  11. Annual physical exam - MM DIGITAL SCREENING BILATERAL; Future - CMP14+EGFR - CBC with Differential/Platelet - Lipid panel - Thyroid Panel With TSH - VITAMIN D 25 Hydroxy (Vit-D Deficiency, Fractures) - Pap IG w/ reflex to HPV when ASC-U  12. Encounter for gynecological examination without abnormal finding - CMP14+EGFR - CBC with Differential/Platelet - Pap IG w/ reflex to HPV when ASC-U  13. Migraine with aura and without status migrainosus, not intractable -Stress management discussed - SUMAtriptan (IMITREX) 100 MG tablet; Take 1 tablet (100 mg total) by mouth every 2 (two) hours as needed for migraine. May repeat in 2 hours if headache persists or recurs.  Dispense: 10 tablet; Refill: 1 - Ambulatory referral to Neurology    Continue all meds Labs pending Health Maintenance reviewed Diet and exercise encouraged RTO 6 months   Evelina Dun, FNP

## 2016-08-11 NOTE — Patient Instructions (Signed)

## 2016-08-12 ENCOUNTER — Other Ambulatory Visit: Payer: Self-pay | Admitting: Family

## 2016-08-12 LAB — CBC WITH DIFFERENTIAL/PLATELET
Basophils Absolute: 0 10*3/uL (ref 0.0–0.2)
Basos: 0 %
EOS (ABSOLUTE): 0.3 10*3/uL (ref 0.0–0.4)
Eos: 4 %
Hematocrit: 40.1 % (ref 34.0–46.6)
Hemoglobin: 13.7 g/dL (ref 11.1–15.9)
Immature Grans (Abs): 0 10*3/uL (ref 0.0–0.1)
Immature Granulocytes: 0 %
Lymphocytes Absolute: 2 10*3/uL (ref 0.7–3.1)
Lymphs: 28 %
MCH: 31.2 pg (ref 26.6–33.0)
MCHC: 34.2 g/dL (ref 31.5–35.7)
MCV: 91 fL (ref 79–97)
Monocytes Absolute: 0.7 10*3/uL (ref 0.1–0.9)
Monocytes: 10 %
Neutrophils Absolute: 4.1 10*3/uL (ref 1.4–7.0)
Neutrophils: 58 %
Platelets: 245 10*3/uL (ref 150–379)
RBC: 4.39 x10E6/uL (ref 3.77–5.28)
RDW: 13.3 % (ref 12.3–15.4)
WBC: 7.1 10*3/uL (ref 3.4–10.8)

## 2016-08-12 LAB — LIPID PANEL
Chol/HDL Ratio: 6.1 ratio units — ABNORMAL HIGH (ref 0.0–4.4)
Cholesterol, Total: 239 mg/dL — ABNORMAL HIGH (ref 100–199)
HDL: 39 mg/dL — ABNORMAL LOW (ref >39)
LDL Calculated: 132 mg/dL — ABNORMAL HIGH (ref 0–99)
Triglycerides: 340 mg/dL — ABNORMAL HIGH (ref 0–149)
VLDL Cholesterol Cal: 68 mg/dL — ABNORMAL HIGH (ref 5–40)

## 2016-08-12 LAB — CMP14+EGFR
ALT: 14 IU/L (ref 0–32)
AST: 15 IU/L (ref 0–40)
Albumin/Globulin Ratio: 1.6 (ref 1.2–2.2)
Albumin: 4.1 g/dL (ref 3.5–5.5)
Alkaline Phosphatase: 66 IU/L (ref 39–117)
BUN/Creatinine Ratio: 12 (ref 9–23)
BUN: 10 mg/dL (ref 6–24)
Bilirubin Total: 0.9 mg/dL (ref 0.0–1.2)
CO2: 23 mmol/L (ref 18–29)
Calcium: 9 mg/dL (ref 8.7–10.2)
Chloride: 102 mmol/L (ref 96–106)
Creatinine, Ser: 0.82 mg/dL (ref 0.57–1.00)
GFR calc Af Amer: 98 mL/min/{1.73_m2} (ref >59)
GFR calc non Af Amer: 85 mL/min/{1.73_m2} (ref >59)
Globulin, Total: 2.5 g/dL (ref 1.5–4.5)
Glucose: 92 mg/dL (ref 65–99)
Potassium: 4 mmol/L (ref 3.5–5.2)
Sodium: 141 mmol/L (ref 134–144)
Total Protein: 6.6 g/dL (ref 6.0–8.5)

## 2016-08-12 LAB — THYROID PANEL WITH TSH
Free Thyroxine Index: 1.9 (ref 1.2–4.9)
T3 Uptake Ratio: 24 % (ref 24–39)
T4, Total: 8 ug/dL (ref 4.5–12.0)
TSH: 1.54 u[IU]/mL (ref 0.450–4.500)

## 2016-08-12 LAB — VITAMIN D 25 HYDROXY (VIT D DEFICIENCY, FRACTURES): Vit D, 25-Hydroxy: 19.9 ng/mL — ABNORMAL LOW (ref 30.0–100.0)

## 2016-08-12 MED ORDER — VITAMIN D (ERGOCALCIFEROL) 1.25 MG (50000 UNIT) PO CAPS: 50000.0000 [IU] | ORAL_CAPSULE | ORAL | 3 refills | Status: AC

## 2016-08-15 LAB — PAP IG W/ RFLX HPV ASCU: PAP Smear Comment: 0

## 2016-09-03 ENCOUNTER — Encounter: Payer: Self-pay | Admitting: *Deleted

## 2016-12-10 ENCOUNTER — Emergency Department (HOSPITAL_COMMUNITY): Payer: Medicare Other

## 2016-12-10 ENCOUNTER — Ambulatory Visit: Payer: Medicare Other | Admitting: Family Medicine

## 2016-12-10 ENCOUNTER — Emergency Department (HOSPITAL_COMMUNITY)
Admission: EM | Admit: 2016-12-10 | Discharge: 2016-12-11 | Disposition: A | Discharge status: Home / Self Care | Payer: Medicare Other | Attending: Emergency Medicine | Admitting: Emergency Medicine

## 2016-12-10 ENCOUNTER — Encounter (HOSPITAL_COMMUNITY): Payer: Self-pay

## 2016-12-10 DIAGNOSIS — Y92009 Unspecified place in unspecified non-institutional (private) residence as the place of occurrence of the external cause: Secondary | ICD-10-CM | POA: Diagnosis not present

## 2016-12-10 DIAGNOSIS — Y9389 Activity, other specified: Secondary | ICD-10-CM | POA: Insufficient documentation

## 2016-12-10 DIAGNOSIS — S59912A Unspecified injury of left forearm, initial encounter: Secondary | ICD-10-CM | POA: Diagnosis present

## 2016-12-10 DIAGNOSIS — W07XXXA Fall from chair, initial encounter: Secondary | ICD-10-CM | POA: Diagnosis not present

## 2016-12-10 DIAGNOSIS — I10 Essential (primary) hypertension: Secondary | ICD-10-CM | POA: Diagnosis not present

## 2016-12-10 DIAGNOSIS — Y999 Unspecified external cause status: Secondary | ICD-10-CM | POA: Insufficient documentation

## 2016-12-10 DIAGNOSIS — Z79899 Other long term (current) drug therapy: Secondary | ICD-10-CM | POA: Diagnosis not present

## 2016-12-10 DIAGNOSIS — Z7982 Long term (current) use of aspirin: Secondary | ICD-10-CM | POA: Insufficient documentation

## 2016-12-10 DIAGNOSIS — S40022A Contusion of left upper arm, initial encounter: Secondary | ICD-10-CM

## 2016-12-10 NOTE — ED Provider Notes (Signed)
[facility]-EMERGENCY DEPT Provider Note   CSN: [id number] Arrival date & time: 12/10/16  2242    By signing my name below, I, Nancy Watts, attest that this documentation has been prepared under the direction and in the presence of Nancy Octave, MD. Electronically Signed: Valentino Watts, ED Scribe. 12/10/16. 11:59 PM.  History   Chief Complaint Chief Complaint  Patient presents with  . Fall   The history is provided by the patient. No language interpreter was used.   HPI Comments: Nancy Watts is a 51 y.o. female with PMHx of fibromyalgia, pulmonary embolism not on anticoagulation who presents to the Emergency Department complaining of 8/10, sudden onset, constant, left forearm pain that occurred earlier today. Pt states she was balancing herself on top of a chair and a wooden step stool that was ~2 feet from the floor while painting her father's house using both hands. She reports losing her balance, falling onto her outstretched left arm. Pt is unsure if she had LOC. She denies head injury. Pt states her left forearm pain radiates to her left wrist, elbow, left shoulder and neck. She states her arm pain is exacerbated with pulling and pushing motions and direct pressure. Pt notes taking an Aspirin at home with minimal relief. She denies dizziness and abdominal pain.  No chest pain or shortness of breath.  Past Medical History:  Diagnosis Date  . Abnormal Pap smear of cervix   . Anemia   . Fibromyalgia   . Hemorrhage   . History of blood transfusion   . Migraines   . Obesity   . Pinched nerve    L4  . Pulmonary emboli (HCC)    x5    Patient Active Problem List   Diagnosis Date Noted  . Degeneration of lumbar or lumbosacral intervertebral disc 06/20/2015  . Morbid obesity (HCC) 01/08/2015  . Hx of pulmonary embolus 12/21/2014  . Hyperlipemia 11/16/2014  . Vitamin D deficiency 11/16/2014  . Insomnia 11/14/2014  . Peripheral neuropathy (HCC) 11/14/2014  . Anemia  11/14/2014  . Essential hypertension 11/14/2014    Past Surgical History:  Procedure Laterality Date  . APPENDECTOMY    . CARDIAC CATHETERIZATION    . CHOLECYSTECTOMY    . DILATION AND CURETTAGE OF UTERUS    . uterine ablation      OB History    Gravida Para Term Preterm AB Living   4 3       3    SAB TAB Ectopic Multiple Live Births         1         Home Medications    Prior to Admission medications   Medication Sig Start Date End Date Taking? Authorizing Provider  albuterol (PROVENTIL HFA;VENTOLIN HFA) 108 (90 Base) MCG/ACT inhaler Inhale 2 puffs into the lungs every 6 (six) hours as needed for wheezing or shortness of breath. 12/20/15   Junie [place], FNP  aspirin 325 MG EC tablet Take 325 mg by mouth daily. Reported on 12/20/2015    Historical Provider, MD  aspirin EC 81 MG tablet Take 81 mg by mouth daily as needed for mild pain or moderate pain. Reported on 12/20/2015    Historical Provider, MD  atorvastatin (LIPITOR) 40 MG tablet Take 1 tablet (40 mg total) by mouth daily. 08/11/16   Junie [place], FNP  gabapentin (NEURONTIN) 100 MG capsule Take 1 capsule (100 mg total) by mouth 2 (two) times daily. 08/11/16   Junie [place], FNP  Melatonin 1  MG TABS Take 1 mg by mouth daily. Reported on 12/20/2015    Historical Provider, MD  Multiple Vitamins-Minerals (WOMENS MULTIVITAMIN PLUS PO) Take 1 tablet by mouth daily. Reported on 12/20/2015    Historical Provider, MD  Probiotic Product (PROBIOTIC DAILY PO) Take 1 tablet by mouth daily. Reported on 12/20/2015    Historical Provider, MD  psyllium (METAMUCIL) 58.6 % powder Take 1 packet by mouth daily as needed (for constipation). Reported on 12/20/2015    Historical Provider, MD  SUMAtriptan (IMITREX) 100 MG tablet Take 1 tablet (100 mg total) by mouth every 2 (two) hours as needed for migraine. May repeat in 2 hours if headache persists or recurs. 08/11/16   Junie [place], FNP  Vitamin D, Ergocalciferol, (DRISDOL) 50000 units CAPS capsule  Take 1 capsule (50,000 Units total) by mouth every 7 (seven) days. 08/12/16   Junie [place], FNP    Family History Family History  Problem Relation Age of Onset  . COPD Mother   . Hypertension Mother   . Hyperlipidemia Mother   . Cancer Father     BONE  . Hypertension Father     Social History Social History  Substance Use Topics  . Smoking status: Never Smoker  . Smokeless tobacco: Never Used  . Alcohol use No     Allergies   Codeine; Eggs or egg-derived products; and Other   Review of Systems Review of Systems  Musculoskeletal: Positive for arthralgias and neck pain.    A complete 10 system review of systems was obtained and all systems are negative except as noted in the HPI and PMH.   Physical Exam Updated Vital Signs BP (!) 146/94 (BP Location: Right Arm)   Pulse 96   Temp 98.9 F (37.2 C) (Oral)   Resp 16   Ht 5\' 4"  (1.626 m)   Wt 200 lb (90.7 kg)   LMP 10/22/2014   SpO2 98%   BMI 34.33 kg/m   Physical Exam  Constitutional: She is oriented to person, place, and time. She appears well-developed and well-nourished. No distress.  Anxious appearing.   HENT:  Head: Normocephalic and atraumatic.  Mouth/Throat: Oropharynx is clear and moist. No oropharyngeal exudate.  Eyes: Conjunctivae and EOM are normal. Pupils are equal, round, and reactive to light.  Neck: Normal range of motion. Neck supple.  No meningismus.  Cardiovascular: Normal rate, regular rhythm, normal heart sounds and intact distal pulses.   No murmur heard. Pulmonary/Chest: Effort normal and breath sounds normal. No respiratory distress.  Abdominal: Soft. There is no tenderness. There is no rebound and no guarding.  Musculoskeletal: Normal range of motion. She exhibits tenderness. She exhibits no edema.  Diffused paraspinal cervical tenderness. Pain with ROM of left shoulder, wrist and hand. No bony tenderness. Able to range left shoulder, elbow and wrist. Diffuse tenderness to dorsal hand  and forearm. Abrasion overlying left AC joint.  Intact radial pulse and grip strengths  Neurological: She is alert and oriented to person, place, and time. No cranial nerve deficit. She exhibits normal muscle tone. Coordination normal.   5/5 strength throughout. CN 2-12 intact.Equal grip strength.   Skin: Skin is warm.  Psychiatric: She has a normal mood and affect. Her behavior is normal.  Nursing note and vitals reviewed.    ED Treatments / Results   DIAGNOSTIC STUDIES: Oxygen Saturation is 98% on RA, normal by my interpretation.    COORDINATION OF CARE: 11:30 PM Discussed treatment plan with pt at bedside which includes left  forearm, wrist, hand and neck imaging which includes  and pt agreed to plan. Pt was offered pain medication and she denied.    Labs (all labs ordered are listed, but only abnormal results are displayed) Labs Reviewed - No data to display  EKG  EKG Interpretation None       Radiology Dg Cervical Spine Complete  Result Date: 12/11/2016 CLINICAL DATA:  Status post fall, with neck pain. Initial encounter. EXAM: CERVICAL SPINE - COMPLETE 4+ VIEW COMPARISON:  None. FINDINGS: There is no evidence of fracture or subluxation. Vertebral bodies demonstrate normal height and alignment. Intervertebral disc spaces are preserved. Prevertebral soft tissues are within normal limits. The provided odontoid view demonstrates no significant abnormality. The visualized lung apices are clear. IMPRESSION: No evidence of fracture or subluxation along the cervical spine. Electronically Signed   By: Roanna Raider M.D.   On: 12/11/2016 01:03   Dg Forearm Left  Result Date: 12/10/2016 CLINICAL DATA:  Larey Seat off chair, with distal left forearm pain. Initial encounter. EXAM: LEFT FOREARM - 2 VIEW COMPARISON:  None. FINDINGS: There is no evidence of fracture or dislocation. The radius and ulna appear grossly intact. The elbow joint is grossly unremarkable. No elbow joint effusion is  identified. Negative ulnar variance is noted. The carpal rows appear grossly intact, and demonstrate normal alignment. Mild dorsal soft tissue swelling is noted at the distal forearm. IMPRESSION: No evidence of fracture or dislocation. Electronically Signed   By: Roanna Raider M.D.   On: 12/10/2016 23:16   Dg Wrist Complete Left  Result Date: 12/11/2016 CLINICAL DATA:  Status post fall off stepping stool, with left wrist pain. Initial encounter. EXAM: LEFT WRIST - COMPLETE 3+ VIEW COMPARISON:  None. FINDINGS: There is no evidence of fracture or dislocation. The carpal rows are intact, and demonstrate normal alignment. Minimal degenerative change is noted at the first carpometacarpal joint. No significant soft tissue abnormalities are seen. IMPRESSION: No evidence of fracture or dislocation. Electronically Signed   By: Roanna Raider M.D.   On: 12/11/2016 01:04   Dg Shoulder Left  Result Date: 12/11/2016 CLINICAL DATA:  Status post fall off stepping stool, with left shoulder pain. Initial encounter. EXAM: LEFT SHOULDER - 2+ VIEW COMPARISON:  None. FINDINGS: There is no evidence of fracture or dislocation. The left humeral head is seated within the glenoid fossa. The acromioclavicular joint is unremarkable in appearance. No significant soft tissue abnormalities are seen. The visualized portions of the left lung are clear. IMPRESSION: No evidence of fracture or dislocation. Electronically Signed   By: Roanna Raider M.D.   On: 12/11/2016 01:05   Dg Hand Complete Left  Result Date: 12/11/2016 CLINICAL DATA:  Status post fall off stepping stool, with left wrist pain. Initial encounter. EXAM: LEFT HAND - COMPLETE 3+ VIEW COMPARISON:  None. FINDINGS: There is no evidence of fracture or dislocation. The joint spaces are preserved. Minimal degenerative change is noted at the first carpometacarpal joint. The carpal rows are intact, and demonstrate normal alignment. The soft tissues are unremarkable in appearance.  IMPRESSION: No evidence of fracture or dislocation. Electronically Signed   By: Roanna Raider M.D.   On: 12/11/2016 01:04    Procedures Procedures (including critical care time)  Medications Ordered in ED Medications - No data to display   Initial Impression / Assessment and Plan / ED Course  I have reviewed the triage vital signs and the nursing notes.  Pertinent labs & imaging results that were available during my care of  the patient were reviewed by me and considered in my medical decision making (see chart for details).     Patient lost balance while painting on step stool and fell onto her left side. Denies hitting head or losing consciousness. Complains of diffuse pain to her left shoulder and arm.  Range of motion limited by pain. Neurovascularly intact.  X-rays are negative for fracture or dislocation. Patient declines any pain medication and states she is "homeopathic" and will only take aspirin.  Pain improved with ice. She is able to range all of her joints without significant problem. Patient is an x-ray results and reassured.  Supportive care discussed including NSAIDs, ice, PCP follow-up. Return precautions discussed.  Final Clinical Impressions(s) / ED Diagnoses   Final diagnoses:  Contusion of left upper extremity, initial encounter    New Prescriptions New Prescriptions   No medications on file    I personally performed the services described in this documentation, which was scribed in my presence. The recorded information has been reviewed and is accurate.     Nancy OctaveStephen Roxy Mastandrea, MD 12/11/16 [phone]

## 2016-12-10 NOTE — ED Triage Notes (Signed)
Patient states that she was painting and fell off of a chair.  Injury to left forearm.  Arm is throbbing.

## 2016-12-11 NOTE — ED Notes (Signed)
Pt alert & oriented x4, stable gait. Patient given discharge instructions, paperwork & prescription(s). Patient  instructed to stop at the registration desk to finish any additional paperwork. Patient verbalized understanding. Pt left department w/ no further questions. 

## 2016-12-11 NOTE — Discharge Instructions (Signed)
Your Xrays are negative.  Use antiinflammatories and ice as needed for pain. Follow up with your doctor. Return to the ED if you develop new or worsening symptoms.

## 2017-01-11 ENCOUNTER — Encounter: Payer: Medicare Other | Admitting: *Deleted

## 2017-01-19 ENCOUNTER — Other Ambulatory Visit: Payer: Self-pay | Admitting: Pediatrics

## 2017-01-25 ENCOUNTER — Telehealth: Payer: Self-pay | Admitting: Family Medicine

## 2017-01-29 NOTE — Telephone Encounter (Signed)
[facility] to x-ray 

## 2017-02-04 NOTE — Telephone Encounter (Signed)
[facility]   02/10/17 at 8:40 Films requested from [facility]

## 2017-02-10 ENCOUNTER — Encounter: Payer: Self-pay | Admitting: Family Medicine

## 2017-02-10 DIAGNOSIS — N644 Mastodynia: Secondary | ICD-10-CM | POA: Diagnosis not present

## 2017-02-10 DIAGNOSIS — N6453 Retraction of nipple: Secondary | ICD-10-CM | POA: Diagnosis not present

## 2017-07-16 DIAGNOSIS — J9811 Atelectasis: Secondary | ICD-10-CM | POA: Diagnosis not present

## 2017-07-16 DIAGNOSIS — E872 Acidosis: Secondary | ICD-10-CM | POA: Diagnosis not present

## 2017-07-16 DIAGNOSIS — R531 Weakness: Secondary | ICD-10-CM | POA: Diagnosis not present

## 2017-07-16 DIAGNOSIS — M7989 Other specified soft tissue disorders: Secondary | ICD-10-CM | POA: Diagnosis not present

## 2017-07-16 DIAGNOSIS — A419 Sepsis, unspecified organism: Secondary | ICD-10-CM | POA: Diagnosis not present

## 2017-07-16 DIAGNOSIS — B9789 Other viral agents as the cause of diseases classified elsewhere: Secondary | ICD-10-CM | POA: Diagnosis not present

## 2017-07-16 DIAGNOSIS — J189 Pneumonia, unspecified organism: Secondary | ICD-10-CM | POA: Diagnosis not present

## 2017-07-16 DIAGNOSIS — Z86711 Personal history of pulmonary embolism: Secondary | ICD-10-CM | POA: Diagnosis not present

## 2017-07-16 DIAGNOSIS — R030 Elevated blood-pressure reading, without diagnosis of hypertension: Secondary | ICD-10-CM | POA: Diagnosis not present

## 2017-07-16 DIAGNOSIS — G43909 Migraine, unspecified, not intractable, without status migrainosus: Secondary | ICD-10-CM | POA: Diagnosis not present

## 2017-08-17 DIAGNOSIS — Z7982 Long term (current) use of aspirin: Secondary | ICD-10-CM | POA: Diagnosis not present

## 2017-08-17 DIAGNOSIS — R05 Cough: Secondary | ICD-10-CM | POA: Diagnosis not present

## 2017-08-17 DIAGNOSIS — R0781 Pleurodynia: Secondary | ICD-10-CM | POA: Diagnosis not present

## 2017-08-17 DIAGNOSIS — M940 Chondrocostal junction syndrome [Tietze]: Secondary | ICD-10-CM | POA: Diagnosis not present

## 2017-09-01 DIAGNOSIS — L03313 Cellulitis of chest wall: Secondary | ICD-10-CM | POA: Diagnosis not present

## 2017-09-01 DIAGNOSIS — G8929 Other chronic pain: Secondary | ICD-10-CM | POA: Diagnosis not present

## 2017-09-01 DIAGNOSIS — Z7982 Long term (current) use of aspirin: Secondary | ICD-10-CM | POA: Diagnosis not present

## 2017-09-01 DIAGNOSIS — M797 Fibromyalgia: Secondary | ICD-10-CM | POA: Diagnosis not present

## 2017-09-21 DIAGNOSIS — Z7689 Persons encountering health services in other specified circumstances: Secondary | ICD-10-CM | POA: Diagnosis not present

## 2017-09-21 DIAGNOSIS — N8110 Cystocele, unspecified: Secondary | ICD-10-CM | POA: Diagnosis not present

## 2017-09-21 DIAGNOSIS — L03313 Cellulitis of chest wall: Secondary | ICD-10-CM | POA: Diagnosis not present

## 2017-11-09 DIAGNOSIS — N8110 Cystocele, unspecified: Secondary | ICD-10-CM | POA: Diagnosis not present

## 2017-11-09 DIAGNOSIS — L853 Xerosis cutis: Secondary | ICD-10-CM | POA: Diagnosis not present

## 2017-11-09 DIAGNOSIS — M205X9 Other deformities of toe(s) (acquired), unspecified foot: Secondary | ICD-10-CM | POA: Diagnosis not present

## 2017-11-09 DIAGNOSIS — B372 Candidiasis of skin and nail: Secondary | ICD-10-CM | POA: Diagnosis not present

## 2017-11-09 DIAGNOSIS — M797 Fibromyalgia: Secondary | ICD-10-CM | POA: Diagnosis not present
